# Patient Record
Sex: Female | Born: 2000
Health system: Southern US, Community
[De-identification: ages and names within clinical notes are randomized; demographics above are authoritative.]

## PROBLEM LIST (undated history)

## (undated) DIAGNOSIS — Z789 Other specified health status: Secondary | ICD-10-CM

## (undated) HISTORY — PX: THROAT SURGERY: SHX803

---
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
## 2018-01-29 ENCOUNTER — Other Ambulatory Visit (HOSPITAL_COMMUNITY)
Admission: RE | Admit: 2018-01-29 | Discharge: 2018-01-29 | Disposition: A | Discharge status: Home / Self Care | Payer: BLUE CROSS/BLUE SHIELD | Source: Ambulatory Visit | Attending: Obstetrics and Gynecology | Admitting: Obstetrics and Gynecology

## 2018-01-29 ENCOUNTER — Other Ambulatory Visit: Payer: Self-pay

## 2018-01-29 ENCOUNTER — Ambulatory Visit (INDEPENDENT_AMBULATORY_CARE_PROVIDER_SITE_OTHER): Payer: BLUE CROSS/BLUE SHIELD | Admitting: Obstetrics and Gynecology

## 2018-01-29 ENCOUNTER — Encounter: Payer: Self-pay | Admitting: Obstetrics and Gynecology

## 2018-01-29 VITALS — BP 100/60 | HR 72 | Resp 16 | Ht 66.25 in | Wt 133.0 lb

## 2018-01-29 DIAGNOSIS — Z113 Encounter for screening for infections with a predominantly sexual mode of transmission: Secondary | ICD-10-CM | POA: Insufficient documentation

## 2018-01-29 DIAGNOSIS — Z01419 Encounter for gynecological examination (general) (routine) without abnormal findings: Secondary | ICD-10-CM

## 2018-01-29 MED ORDER — ETONOGESTREL-ETHINYL ESTRADIOL 0.12-0.015 MG/24HR VA RING: 1.0000 | VAGINAL_RING | VAGINAL | 11 refills | Status: DC

## 2018-01-29 NOTE — Progress Notes (Signed)
17 y.o. G0P0000 Single Caucasian female here as a new patient for an annual exam. Referred from Pediatrician - Dr. Loyola Mast.  On OCPs since sophomore in HS. Makes her period lighter.  Not sure she wants to take a pill every day.  Sexually active but not regularly.  No specific problems on OCPs. Cycles were more heavy off OCPs.  No personal hx of migraine with aura, HTN, DVT or PE or FH of this, liver disease.  Just finished her first year at [employer].  Has a job at [employer].   PCP: PCP through [facility]    Patient's last menstrual period was 01/03/2018 (within days).     Period Cycle (Days): 28 Period Duration (Days): 2 Period Pattern: Regular Menstrual Flow: Light Menstrual Control: Tampon Menstrual Control Change Freq (Hours): 5 Dysmenorrhea: (!) Mild Dysmenorrhea Symptoms: Cramping, Headache     Sexually active: Yes.    The current method of family planning is JUNEL FE.    Exercising: Yes.    yoga and walking Smoker:  no  Health Maintenance: Pap:  n/a History of abnormal Pap:  n/a TDaP:  About June 2018 Gardasil:   Yes, completed series HIV: never Hep C: never Screening Labs:  Discuss today   reports that she has never smoked. She has never used smokeless tobacco. She reports that she drinks alcohol. She reports that she does not use drugs.  History reviewed. No pertinent past medical history.  Past Surgical History:  Procedure Laterality Date  . THROAT SURGERY     cyst on throat    Current Outpatient Medications  Medication Sig Dispense Refill  . JUNEL FE 24 1-20 MG-MCG(24) tablet Take 1 tablet by mouth daily.  1   No current facility-administered medications for this visit.     Family History  Problem Relation Age of Onset  . Kidney cancer Father   . Hypertension Maternal Grandfather   . Breast cancer Paternal Grandmother     Review of Systems  Constitutional: Negative.   HENT: Negative.   Eyes: Negative.   Respiratory: Negative.    Cardiovascular: Negative.   Gastrointestinal: Negative.   Endocrine: Negative.   Genitourinary: Negative.   Musculoskeletal: Negative.   Skin: Negative.   Allergic/Immunologic: Negative.   Neurological: Negative.   Hematological: Negative.   Psychiatric/Behavioral: Negative.     Exam:   BP 100/60 (BP Location: Right Arm, Patient Position: Sitting, Cuff Size: Normal)   Pulse 72   Resp 16   Ht 5' 6.25" (1.683 m)   Wt 133 lb (60.3 kg)   LMP 01/03/2018 (Within Days)   BMI 21.31 kg/m     General appearance: alert, cooperative and appears stated age Head: Normocephalic, without obvious abnormality, atraumatic Neck: no adenopathy, supple, symmetrical, trachea midline and thyroid normal to inspection and palpation Lungs: clear to auscultation bilaterally Breasts: normal appearance, no masses or tenderness, No nipple retraction or dimpling, No nipple discharge or bleeding, No axillary or supraclavicular adenopathy Heart: regular rate and rhythm Abdomen: soft, non-tender; no masses, no organomegaly Extremities: extremities normal, atraumatic, no cyanosis or edema Skin: Skin color, texture, turgor normal. No rashes or lesions Lymph nodes: Cervical, supraclavicular, and axillary nodes normal. No abnormal inguinal nodes palpated Neurologic: Grossly normal  Pelvic: External genitalia:  no lesions              Urethra:  normal appearing urethra with no masses, tenderness or lesions              Bartholins  and Skenes: normal                 Vagina: normal appearing vagina with normal color and discharge, no lesions              Cervix: no lesions              Pap taken: No. Bimanual Exam:  Uterus:  normal size, contour, position, consistency, mobility, non-tender              Adnexa: no mass, fullness, tenderness             Chaperone was present for exam.  Assessment:   Well woman visit with normal exam. STD screening.  Contraceptive counseling.   Plan: Mammogram  screening. Recommended self breast awareness. Pap and HR HPV as above. Guidelines for Calcium, Vitamin D, regular exercise program including cardiovascular and weight bearing exercise. STD screening.  I recommend condom use.  We discussed contraceptive options - pills, ring, patch, IUDs, Depo Provera.   She will try NuvaRing.  Instructed in used.  Refills for one year.  Follow up annually and prn.   After visit summary provided.

## 2018-01-29 NOTE — Patient Instructions (Signed)

## 2018-01-30 LAB — CERVICOVAGINAL ANCILLARY ONLY
Chlamydia: NEGATIVE
Neisseria Gonorrhea: NEGATIVE
Trichomonas: NEGATIVE

## 2018-01-30 LAB — HEP, RPR, HIV PANEL
HIV Screen 4th Generation wRfx: NONREACTIVE
Hepatitis B Surface Ag: NEGATIVE
RPR Ser Ql: NONREACTIVE

## 2018-01-30 LAB — HEPATITIS C ANTIBODY: Hep C Virus Ab: <0.1 s/co ratio (ref 0.0–0.9)

## 2018-08-19 ENCOUNTER — Encounter: Payer: Self-pay | Admitting: Obstetrics and Gynecology

## 2018-08-19 ENCOUNTER — Other Ambulatory Visit: Payer: Self-pay

## 2018-08-19 ENCOUNTER — Ambulatory Visit (INDEPENDENT_AMBULATORY_CARE_PROVIDER_SITE_OTHER): Payer: BLUE CROSS/BLUE SHIELD | Admitting: Obstetrics and Gynecology

## 2018-08-19 ENCOUNTER — Telehealth: Payer: Self-pay | Admitting: Obstetrics and Gynecology

## 2018-08-19 VITALS — BP 124/80 | HR 64 | Wt 136.2 lb

## 2018-08-19 DIAGNOSIS — B373 Candidiasis of vulva and vagina: Secondary | ICD-10-CM | POA: Diagnosis not present

## 2018-08-19 DIAGNOSIS — B3731 Acute candidiasis of vulva and vagina: Secondary | ICD-10-CM

## 2018-08-19 DIAGNOSIS — N760 Acute vaginitis: Secondary | ICD-10-CM | POA: Diagnosis not present

## 2018-08-19 DIAGNOSIS — B9689 Other specified bacterial agents as the cause of diseases classified elsewhere: Secondary | ICD-10-CM | POA: Diagnosis not present

## 2018-08-19 MED ORDER — METRONIDAZOLE 0.75 % VA GEL
1.0000 | Freq: Every day | VAGINAL | 0 refills | Status: DC
Start: 2018-08-19 — End: 2018-12-04

## 2018-08-19 MED ORDER — FLUCONAZOLE 150 MG PO TABS: 150.0000 mg | ORAL_TABLET | Freq: Once | ORAL | 0 refills | Status: AC

## 2018-08-19 NOTE — Progress Notes (Signed)
GYNECOLOGY  VISIT   HPI: 17 y.o.   Single White or Caucasian Not Hispanic or Latino  female   G0P0000 with Patient's last menstrual period was 07/30/2018.   here for retained tampon since last menses that she pulled it out today. Reports she is having vaginal discharge with an odor. Denies any fever or chills. Since she took the tampon out she has had mild cramping. No pain prior to today. She was having a BM today and the tampon came down to the point that she could feel it. She has noticed an odor over the last 2 weeks.   GYNECOLOGIC HISTORY: Patient's last menstrual period was 07/30/2018. Contraception: Nuvaring Menopausal hormone therapy: None        OB History    Gravida  0   Para  0   Term  0   Preterm  0   AB  0   Living  0     SAB  0   TAB  0   Ectopic  0   Multiple  0   Live Births  0              There are no active problems to display for this patient.   History reviewed. No pertinent past medical history.  Past Surgical History:  Procedure Laterality Date  . THROAT SURGERY     cyst on throat    Current Outpatient Medications  Medication Sig Dispense Refill  . etonogestrel-ethinyl estradiol (NUVARING) 0.12-0.015 MG/24HR vaginal ring Place 1 each vaginally every 28 (twenty-eight) days. Insert vaginally and leave in place for 3 consecutive weeks, then remove for 1 week. 1 each 11   No current facility-administered medications for this visit.      ALLERGIES: Penicillins  Family History  Problem Relation Age of Onset  . Kidney cancer Father   . Hypertension Maternal Grandfather   . Breast cancer Paternal Grandmother     Social History   Socioeconomic History  . Marital status: Single    Spouse name: Not on file  . Number of children: Not on file  . Years of education: Not on file  . Highest education level: Not on file  Occupational History  . Not on file  Social Needs  . Financial resource strain: Not on file  . Food insecurity:     Worry: Not on file    Inability: Not on file  . Transportation needs:    Medical: Not on file    Non-medical: Not on file  Tobacco Use  . Smoking status: Never Smoker  . Smokeless tobacco: Never Used  Substance and Sexual Activity  . Alcohol use: Yes    Comment: twice a week   . Drug use: No  . Sexual activity: Yes    Birth control/protection: Inserts    Comment: Nuvaring  Lifestyle  . Physical activity:    Days per week: Not on file    Minutes per session: Not on file  . Stress: Not on file  Relationships  . Social connections:    Talks on phone: Not on file    Gets together: Not on file    Attends religious service: Not on file    Active member of club or organization: Not on file    Attends meetings of clubs or organizations: Not on file    Relationship status: Not on file  . Intimate partner violence:    Fear of current or ex partner: Not on file    Emotionally  abused: Not on file    Physically abused: Not on file    Forced sexual activity: Not on file  Other Topics Concern  . Not on file  Social History Narrative  . Not on file    Review of Systems  Constitutional: Negative.   HENT: Negative.   Eyes: Negative.   Respiratory: Negative.   Cardiovascular: Negative.   Gastrointestinal: Negative.   Genitourinary:       Vaginal discharge Vaginal odor  Musculoskeletal: Negative.   Skin: Negative.   Neurological: Negative.   Endo/Heme/Allergies: Negative.   Psychiatric/Behavioral: Negative.     PHYSICAL EXAMINATION:    BP 124/80 (BP Location: Right Arm, Patient Position: Sitting, Cuff Size: Normal)   Pulse 64   Wt 136 lb 3.2 oz (61.8 kg)   LMP 07/30/2018   BMI 21.82 kg/m     General appearance: alert, cooperative and appears stated age   Pelvic: External genitalia:  no lesions              Urethra:  normal appearing urethra with no masses, tenderness or lesions              Bartholins and Skenes: normal                 Vagina: normal appearing  vagina with a slight increase in watery and thick white vaginal d/c, no erosions, no lesions              Cervix: no cervical motion tenderness and no lesions              Bimanual Exam:  Uterus:  normal size, contour, position, consistency, mobility, non-tender and anteverted              Adnexa: no masses, mildly tender left adnexa.               Chaperone was present for exam.  Wet prep: + clue, no trich, + wbc KOH: +++ yeast PH: 5   ASSESSMENT Patient had a tampon in for 3 weeks, pulled it out today C/O vaginal odor Mild cramping, no signs of pelvic infection on exam BV and yeast on exam    PLAN Metrogel and diflucan Call with any concerns   An After Visit Summary was printed and given to the patient.

## 2018-08-19 NOTE — Telephone Encounter (Signed)
Patient would like to speak with a nurse. States she had a tampon in that she did not know was there.

## 2018-08-19 NOTE — Patient Instructions (Signed)

## 2018-08-19 NOTE — Telephone Encounter (Signed)
Spoke with patient. Patient states she had a BMD today and a tampon came out of vagina the same time. Patient states her last menses was 3 wks ago, that was the last time she used a tampon. Patient reports vaginal odor. Denies vaginal d/c, fever/chills, pelvic pain. Uses Nuvaring for contraceptive. [facility] [place] [facility]. Requesting OV. Is agreeable to scheduling with covering provider.   Patient placed on brief hold, reviewed Dr. Oscar LaJertson schedule with Fredrich BirksK. Sprague, RN.   Instructed patient to come directly to office, scheduled for 4pm, will work into schedule.   Routing to provider for final review. Patient is agreeable to disposition. Will close encounter.  Cc: Dr. Edward JollySilva

## 2018-09-24 DIAGNOSIS — E781 Pure hyperglyceridemia: Secondary | ICD-10-CM

## 2018-09-24 HISTORY — DX: Pure hyperglyceridemia: E78.1

## 2018-10-26 DIAGNOSIS — J020 Streptococcal pharyngitis: Secondary | ICD-10-CM | POA: Diagnosis not present

## 2018-12-02 DIAGNOSIS — S42422A Displaced comminuted supracondylar fracture without intercondylar fracture of left humerus, initial encounter for closed fracture: Secondary | ICD-10-CM | POA: Diagnosis not present

## 2018-12-02 DIAGNOSIS — S59902A Unspecified injury of left elbow, initial encounter: Secondary | ICD-10-CM | POA: Diagnosis not present

## 2018-12-02 DIAGNOSIS — M25522 Pain in left elbow: Secondary | ICD-10-CM | POA: Diagnosis not present

## 2018-12-02 DIAGNOSIS — Y9323 Activity, snow (alpine) (downhill) skiing, snow boarding, sledding, tobogganing and snow tubing: Secondary | ICD-10-CM | POA: Diagnosis not present

## 2018-12-02 DIAGNOSIS — Y929 Unspecified place or not applicable: Secondary | ICD-10-CM | POA: Diagnosis not present

## 2018-12-04 ENCOUNTER — Other Ambulatory Visit: Payer: Self-pay

## 2018-12-04 ENCOUNTER — Encounter (HOSPITAL_COMMUNITY): Payer: Self-pay | Admitting: *Deleted

## 2018-12-04 DIAGNOSIS — M25522 Pain in left elbow: Secondary | ICD-10-CM | POA: Diagnosis not present

## 2018-12-04 DIAGNOSIS — S42472A Displaced transcondylar fracture of left humerus, initial encounter for closed fracture: Secondary | ICD-10-CM | POA: Diagnosis not present

## 2018-12-04 NOTE — H&P (Signed)
Sherronda Waddell is an 18 y.o. female.   Chief Complaint: LEFT ELBOW INJURY  HPI: The patient is a 18 year old right-hand dominant female who injured her left elbow on March 10 while skiing in [place]. She was examined after the injury and had radiographs and a CT scan. She was put into a splint and came home for further treatment.  She was seen in our office. She continues to have pain, weakness, stiffness, and swelling.   We discussed the need for surgical intervention to realign the bone.  The patient will remain in the splint until the time of surgery.  She is here today for surgery.  She denies chest pain, shortness of breath, nausea, vomiting, diarrhea, fever, or chills.    No past medical history on file.  Past Surgical History:  Procedure Laterality Date  . THROAT SURGERY     cyst on throat    Family History  Problem Relation Age of Onset  . Kidney cancer Father   . Hypertension Maternal Grandfather   . Breast cancer Paternal Grandmother    Social History:  reports that she has never smoked. She has never used smokeless tobacco. She reports current alcohol use. She reports that she does not use drugs.  Allergies:  Allergies  Allergen Reactions  . Penicillins Rash    Did it involve swelling of the face/tongue/throat, SOB, or low BP? Unknown Did it involve sudden or severe rash/hives, skin peeling, or any reaction on the inside of your mouth or nose? Yes Did you need to seek medical attention at a hospital or doctor's office? No When did it last happen?childhood  If all above answers are "NO", may proceed with cephalosporin use.     No medications prior to admission.    No results found for this or any previous visit (from the past 48 hour(s)). No results found.  Review of Systems  Respiratory: Positive for wheezing.    NO RECENT ILLNESSES OR HOSPITALIZATIONS  There were no vitals taken for this visit. Physical Exam  General Appearance:  Alert,  cooperative, no distress, appears stated age  Head:  Normocephalic, without obvious abnormality, atraumatic  Eyes:  Pupils equal, conjunctiva/corneas clear,         Throat: Lips, mucosa, and tongue normal; teeth and gums normal  Neck: No visible masses     Lungs:   respirations unlabored  Chest Wall:  No tenderness or deformity  Heart:  Regular rate and rhythm,  Abdomen:   Soft, non-tender,         Extremities: LUE: splint in place, fingers warm well perfused Weakness with ulnar nerve function Able to cross fingers, able to make a ok sign   Pulses: 2+ and symmetric  Skin: Skin color, texture, turgor normal, no rashes or lesions     Neurologic: Normal     Assessment LEFT ELBOW DISTAL HUMERUS FRACTURE   Plan LEFT ELBOW OPEN REDUCTION AND INTERNAL FIXATION WITH REPAIR AS INDICATED    WE ARE PLANNING SURGERY FOR YOUR UPPER EXTREMITY. THE RISKS AND BENEFITS OF SURGERY INCLUDE BUT NOT LIMITED TO BLEEDING INFECTION, DAMAGE TO NEARBY NERVES ARTERIES TENDONS, FAILURE OF SURGERY TO ACCOMPLISH ITS INTENDED GOALS, PERSISTENT SYMPTOMS AND NEED FOR FURTHER SURGICAL INTERVENTION. WITH THIS IN MIND WE WILL PROCEED. I HAVE DISCUSSED WITH THE PATIENT THE PRE AND POSTOPERATIVE REGIMEN AND THE DOS AND DON'TS. PT VOICED UNDERSTANDING AND INFORMED CONSENT SIGNED.   R/B/A DISCUSSED WITH PT IN OFFICE.  PT VOICED UNDERSTANDING OF PLAN CONSENT SIGNED DAY  OF SURGERY PT SEEN AND EXAMINED PRIOR TO OPERATIVE PROCEDURE/DAY OF SURGERY SITE MARKED. QUESTIONS ANSWERED WILL REMAIN IN HOSPITAL 23 HOUR OBSERVATION FOLLOWING SURGERY  Lory Nowaczyk [facility] MD 12/05/18    Karma Greaser 12/04/2018, 3:16 PM

## 2018-12-04 NOTE — Progress Notes (Signed)
Spoke with pt's mother, Zella Ball for pre-op call. DPR on file. She states pt does not have a cardiac history.

## 2018-12-05 ENCOUNTER — Ambulatory Visit (HOSPITAL_COMMUNITY): Payer: BLUE CROSS/BLUE SHIELD | Admitting: Anesthesiology

## 2018-12-05 ENCOUNTER — Ambulatory Visit (HOSPITAL_COMMUNITY)
Admission: RE | Admit: 2018-12-05 | Discharge: 2018-12-07 | Disposition: A | Discharge status: Home / Self Care | Payer: BLUE CROSS/BLUE SHIELD | Attending: Orthopedic Surgery | Admitting: Orthopedic Surgery

## 2018-12-05 ENCOUNTER — Encounter (HOSPITAL_COMMUNITY): Payer: Self-pay

## 2018-12-05 ENCOUNTER — Other Ambulatory Visit: Payer: Self-pay

## 2018-12-05 ENCOUNTER — Encounter (HOSPITAL_COMMUNITY)
Admission: RE | Disposition: A | Discharge status: Home / Self Care | Payer: Self-pay | Source: Home / Self Care | Attending: Orthopedic Surgery

## 2018-12-05 DIAGNOSIS — S42472A Displaced transcondylar fracture of left humerus, initial encounter for closed fracture: Secondary | ICD-10-CM | POA: Insufficient documentation

## 2018-12-05 DIAGNOSIS — Z880 Allergy status to penicillin: Secondary | ICD-10-CM | POA: Diagnosis not present

## 2018-12-05 DIAGNOSIS — S42402A Unspecified fracture of lower end of left humerus, initial encounter for closed fracture: Secondary | ICD-10-CM | POA: Diagnosis not present

## 2018-12-05 DIAGNOSIS — S42472D Displaced transcondylar fracture of left humerus, subsequent encounter for fracture with routine healing: Secondary | ICD-10-CM | POA: Diagnosis not present

## 2018-12-05 DIAGNOSIS — G8918 Other acute postprocedural pain: Secondary | ICD-10-CM | POA: Diagnosis not present

## 2018-12-05 DIAGNOSIS — Y9323 Activity, snow (alpine) (downhill) skiing, snow boarding, sledding, tobogganing and snow tubing: Secondary | ICD-10-CM | POA: Insufficient documentation

## 2018-12-05 HISTORY — PX: ORIF HUMERUS FRACTURE: SHX2126

## 2018-12-05 HISTORY — DX: Other specified health status: Z78.9

## 2018-12-05 LAB — CBC
HCT: 39.1 % (ref 36.0–46.0)
Hemoglobin: 12.7 g/dL (ref 12.0–15.0)
MCH: 28.4 pg (ref 26.0–34.0)
MCHC: 32.5 g/dL (ref 30.0–36.0)
MCV: 87.5 fL (ref 80.0–100.0)
Platelets: 322 10*3/uL (ref 150–400)
RBC: 4.47 MIL/uL (ref 3.87–5.11)
RDW: 11.9 % (ref 11.5–15.5)
WBC: 8.1 10*3/uL (ref 4.0–10.5)
nRBC: 0 % (ref 0.0–0.2)

## 2018-12-05 LAB — POCT PREGNANCY, URINE: Preg Test, Ur: NEGATIVE

## 2018-12-05 SURGERY — OPEN REDUCTION INTERNAL FIXATION (ORIF) DISTAL HUMERUS FRACTURE
Anesthesia: General | Site: Arm Upper | Laterality: Left

## 2018-12-05 MED ORDER — MIDAZOLAM HCL 2 MG/2ML IJ SOLN
INTRAMUSCULAR | Status: AC
  Administered 2018-12-05: 2 mg via INTRAVENOUS
  Filled 2018-12-05: qty 2

## 2018-12-05 MED ORDER — LIDOCAINE HCL (CARDIAC) PF 100 MG/5ML IV SOSY
PREFILLED_SYRINGE | INTRAVENOUS | Status: DC | PRN
  Administered 2018-12-05: 60 mg via INTRAVENOUS

## 2018-12-05 MED ORDER — ONDANSETRON HCL 4 MG PO TABS: 4.0000 mg | ORAL_TABLET | Freq: Four times a day (QID) | ORAL | Status: DC | PRN

## 2018-12-05 MED ORDER — BUPIVACAINE HCL (PF) 0.25 % IJ SOLN
INTRAMUSCULAR | Status: AC
  Filled 2018-12-05: qty 30

## 2018-12-05 MED ORDER — OXYCODONE HCL 5 MG PO TABS: 5.0000 mg | ORAL_TABLET | Freq: Once | ORAL | Status: DC | PRN

## 2018-12-05 MED ORDER — ONDANSETRON HCL 4 MG/2ML IJ SOLN
INTRAMUSCULAR | Status: DC | PRN
  Administered 2018-12-05: 4 mg via INTRAVENOUS

## 2018-12-05 MED ORDER — MEPERIDINE HCL 50 MG/ML IJ SOLN: 6.2500 mg | INTRAMUSCULAR | Status: DC | PRN

## 2018-12-05 MED ORDER — KCL IN DEXTROSE-NACL 20-5-0.45 MEQ/L-%-% IV SOLN
INTRAVENOUS | Status: DC
  Administered 2018-12-05: 22:00:00 via INTRAVENOUS
  Filled 2018-12-05: qty 1000

## 2018-12-05 MED ORDER — LACTATED RINGERS IV SOLN
INTRAVENOUS | Status: DC | PRN
  Administered 2018-12-05 (×2): via INTRAVENOUS

## 2018-12-05 MED ORDER — OXYCODONE HCL 5 MG PO TABS
5.0000 mg | ORAL_TABLET | ORAL | Status: DC | PRN
  Administered 2018-12-06 (×6): 10 mg via ORAL
  Administered 2018-12-07 (×2): 5 mg via ORAL
  Filled 2018-12-05: qty 2
  Filled 2018-12-05: qty 1
  Filled 2018-12-05 (×3): qty 2
  Filled 2018-12-05: qty 1
  Filled 2018-12-05 (×2): qty 2

## 2018-12-05 MED ORDER — ONDANSETRON HCL 4 MG/2ML IJ SOLN
4.0000 mg | Freq: Once | INTRAMUSCULAR | Status: AC | PRN
  Administered 2018-12-05: 4 mg via INTRAVENOUS

## 2018-12-05 MED ORDER — ROCURONIUM 10MG/ML (10ML) SYRINGE FOR MEDFUSION PUMP - OPTIME
INTRAVENOUS | Status: DC | PRN
  Administered 2018-12-05: 20 mg via INTRAVENOUS
  Administered 2018-12-05: 30 mg via INTRAVENOUS

## 2018-12-05 MED ORDER — ONDANSETRON HCL 4 MG/2ML IJ SOLN
INTRAMUSCULAR | Status: AC
  Filled 2018-12-05: qty 2

## 2018-12-05 MED ORDER — ALPRAZOLAM 0.5 MG PO TABS: 0.5000 mg | ORAL_TABLET | Freq: Four times a day (QID) | ORAL | Status: DC | PRN

## 2018-12-05 MED ORDER — FENTANYL CITRATE (PF) 100 MCG/2ML IJ SOLN
INTRAMUSCULAR | Status: AC
  Administered 2018-12-05: 100 ug via INTRAVENOUS
  Filled 2018-12-05: qty 2

## 2018-12-05 MED ORDER — CLINDAMYCIN PHOSPHATE 900 MG/50ML IV SOLN
INTRAVENOUS | Status: AC
  Filled 2018-12-05: qty 50

## 2018-12-05 MED ORDER — PROPOFOL 10 MG/ML IV BOLUS
INTRAVENOUS | Status: DC | PRN
  Administered 2018-12-05: 150 mg via INTRAVENOUS

## 2018-12-05 MED ORDER — ACETAMINOPHEN 160 MG/5ML PO SOLN: 325.0000 mg | ORAL | Status: DC | PRN

## 2018-12-05 MED ORDER — CLINDAMYCIN PHOSPHATE 900 MG/50ML IV SOLN
900.0000 mg | INTRAVENOUS | Status: AC
  Administered 2018-12-05: 900 mg via INTRAVENOUS

## 2018-12-05 MED ORDER — MIDAZOLAM HCL 2 MG/2ML IJ SOLN
INTRAMUSCULAR | Status: AC
  Filled 2018-12-05: qty 2

## 2018-12-05 MED ORDER — METHOCARBAMOL 500 MG PO TABS
500.0000 mg | ORAL_TABLET | Freq: Four times a day (QID) | ORAL | Status: DC | PRN
  Administered 2018-12-06 (×3): 500 mg via ORAL
  Filled 2018-12-05 (×3): qty 1

## 2018-12-05 MED ORDER — DEXAMETHASONE SODIUM PHOSPHATE 10 MG/ML IJ SOLN
INTRAMUSCULAR | Status: DC | PRN
  Administered 2018-12-05: 5 mg via INTRAVENOUS

## 2018-12-05 MED ORDER — FENTANYL CITRATE (PF) 250 MCG/5ML IJ SOLN
INTRAMUSCULAR | Status: DC | PRN
  Administered 2018-12-05 (×3): 50 ug via INTRAVENOUS

## 2018-12-05 MED ORDER — MIDAZOLAM HCL 2 MG/2ML IJ SOLN
2.0000 mg | Freq: Once | INTRAMUSCULAR | Status: AC
  Administered 2018-12-05: 2 mg via INTRAVENOUS
  Filled 2018-12-05: qty 2

## 2018-12-05 MED ORDER — FENTANYL CITRATE (PF) 100 MCG/2ML IJ SOLN: 25.0000 ug | INTRAMUSCULAR | Status: DC | PRN

## 2018-12-05 MED ORDER — METHOCARBAMOL 1000 MG/10ML IJ SOLN
500.0000 mg | Freq: Four times a day (QID) | INTRAVENOUS | Status: DC | PRN
  Filled 2018-12-05: qty 5

## 2018-12-05 MED ORDER — FENTANYL CITRATE (PF) 100 MCG/2ML IJ SOLN
100.0000 ug | Freq: Once | INTRAMUSCULAR | Status: AC
  Administered 2018-12-05: 100 ug via INTRAVENOUS
  Filled 2018-12-05: qty 2

## 2018-12-05 MED ORDER — SODIUM CHLORIDE 0.9 % IV SOLN
INTRAVENOUS | Status: DC | PRN
  Administered 2018-12-05: 25 ug/min via INTRAVENOUS

## 2018-12-05 MED ORDER — ADULT MULTIVITAMIN W/MINERALS CH
1.0000 | ORAL_TABLET | Freq: Every day | ORAL | Status: DC
  Administered 2018-12-06 – 2018-12-07 (×2): 1 via ORAL
  Filled 2018-12-05 (×2): qty 1

## 2018-12-05 MED ORDER — HYDROMORPHONE HCL 1 MG/ML IJ SOLN
0.5000 mg | INTRAMUSCULAR | Status: DC | PRN
  Administered 2018-12-06 – 2018-12-07 (×5): 1 mg via INTRAVENOUS
  Filled 2018-12-05 (×5): qty 1

## 2018-12-05 MED ORDER — FENTANYL CITRATE (PF) 250 MCG/5ML IJ SOLN
INTRAMUSCULAR | Status: AC
  Filled 2018-12-05: qty 5

## 2018-12-05 MED ORDER — ONDANSETRON HCL 4 MG/2ML IJ SOLN: 4.0000 mg | Freq: Four times a day (QID) | INTRAMUSCULAR | Status: DC | PRN

## 2018-12-05 MED ORDER — HYDROCODONE-ACETAMINOPHEN 7.5-325 MG PO TABS
1.0000 | ORAL_TABLET | ORAL | Status: DC | PRN
  Administered 2018-12-06: 1 via ORAL
  Filled 2018-12-05: qty 1

## 2018-12-05 MED ORDER — CLINDAMYCIN PHOSPHATE 600 MG/50ML IV SOLN
600.0000 mg | Freq: Three times a day (TID) | INTRAVENOUS | Status: AC
  Administered 2018-12-05 – 2018-12-06 (×4): 600 mg via INTRAVENOUS
  Filled 2018-12-05 (×6): qty 50

## 2018-12-05 MED ORDER — BUPIVACAINE HCL (PF) 0.25 % IJ SOLN
INTRAMUSCULAR | Status: DC | PRN
  Administered 2018-12-05: 30 mL

## 2018-12-05 MED ORDER — KETOROLAC TROMETHAMINE 30 MG/ML IJ SOLN: 30.0000 mg | Freq: Once | INTRAMUSCULAR | Status: DC | PRN

## 2018-12-05 MED ORDER — PROPOFOL 10 MG/ML IV BOLUS
INTRAVENOUS | Status: AC
  Filled 2018-12-05: qty 20

## 2018-12-05 MED ORDER — DIPHENHYDRAMINE HCL 25 MG PO CAPS: 25.0000 mg | ORAL_CAPSULE | Freq: Four times a day (QID) | ORAL | Status: DC | PRN

## 2018-12-05 MED ORDER — MIDAZOLAM HCL 5 MG/5ML IJ SOLN
INTRAMUSCULAR | Status: DC | PRN
  Administered 2018-12-05: 2 mg via INTRAVENOUS

## 2018-12-05 MED ORDER — OXYCODONE HCL 5 MG/5ML PO SOLN: 5.0000 mg | Freq: Once | ORAL | Status: DC | PRN

## 2018-12-05 MED ORDER — ACETAMINOPHEN 325 MG PO TABS: 325.0000 mg | ORAL_TABLET | ORAL | Status: DC | PRN

## 2018-12-05 MED ORDER — ETONOGESTREL-ETHINYL ESTRADIOL 0.12-0.015 MG/24HR VA RING: 1.0000 | VAGINAL_RING | VAGINAL | Status: DC

## 2018-12-05 MED ORDER — 0.9 % SODIUM CHLORIDE (POUR BTL) OPTIME
TOPICAL | Status: DC | PRN
  Administered 2018-12-05 (×2): 1000 mL

## 2018-12-05 MED ORDER — GLYCOPYRROLATE 0.2 MG/ML IJ SOLN
INTRAMUSCULAR | Status: DC | PRN
  Administered 2018-12-05: 0.4 mg via INTRAVENOUS

## 2018-12-05 MED ORDER — CHLORHEXIDINE GLUCONATE 4 % EX LIQD: 60.0000 mL | Freq: Once | CUTANEOUS | Status: DC

## 2018-12-05 MED ORDER — NEOSTIGMINE METHYLSULFATE 10 MG/10ML IV SOLN
INTRAVENOUS | Status: DC | PRN
  Administered 2018-12-05: 3 mg via INTRAVENOUS

## 2018-12-05 SURGICAL SUPPLY — 107 items
BANDAGE ACE 3X5.8 VEL STRL LF (GAUZE/BANDAGES/DRESSINGS) ×1 IMPLANT
BANDAGE ACE 4X5 VEL STRL LF (GAUZE/BANDAGES/DRESSINGS) ×2 IMPLANT
BIT DRILL 1.8 CANN MAX VPC (BIT) ×1 IMPLANT
BIT DRILL 2.0 (BIT) ×2
BIT DRILL 2.5X2.75 QC CALB (BIT) ×2 IMPLANT
BIT DRILL 2XNS DISP SS SM FRAG (BIT) IMPLANT
BIT DRILL 4.8X200 CANN (BIT) ×1 IMPLANT
BIT DRILL CALIBRATED 2.7 (BIT) ×1 IMPLANT
BIT DRL 2XNS DISP SS SM FRAG (BIT) ×1
BLADE LONG MED 31X9 (MISCELLANEOUS) ×1 IMPLANT
BNDG CMPR 9X4 STRL LF SNTH (GAUZE/BANDAGES/DRESSINGS) ×1
BNDG ESMARK 4X9 LF (GAUZE/BANDAGES/DRESSINGS) ×2 IMPLANT
BNDG GAUZE ELAST 4 BULKY (GAUZE/BANDAGES/DRESSINGS) ×1 IMPLANT
CABLE CERCLAGE W/NDL CRIMP (Cable) IMPLANT
CABLE CERLAGE W/NEEDLE CRIMP (Cable) ×2 IMPLANT
CONNECTOR 5 IN 1 STRAIGHT STRL (MISCELLANEOUS) ×1 IMPLANT
CORDS BIPOLAR (ELECTRODE) ×1 IMPLANT
COVER MAYO STAND STRL (DRAPES) ×2 IMPLANT
COVER SURGICAL LIGHT HANDLE (MISCELLANEOUS) ×2 IMPLANT
COVER WAND RF STERILE (DRAPES) ×2 IMPLANT
CUFF TOURN SGL QUICK 18X4 (TOURNIQUET CUFF) ×1 IMPLANT
CUFF TOURNIQUET SINGLE 18IN (TOURNIQUET CUFF) ×2 IMPLANT
CUFF TOURNIQUET SINGLE 24IN (TOURNIQUET CUFF) IMPLANT
DRAPE IMP U-DRAPE 54X76 (DRAPES) ×2 IMPLANT
DRAPE INCISE IOBAN 66X45 STRL (DRAPES) ×1 IMPLANT
DRAPE OEC MINIVIEW 54X84 (DRAPES) IMPLANT
DRSG ADAPTIC 3X8 NADH LF (GAUZE/BANDAGES/DRESSINGS) IMPLANT
GAUZE SPONGE 4X4 12PLY STRL (GAUZE/BANDAGES/DRESSINGS) ×1 IMPLANT
GAUZE XEROFORM 5X9 LF (GAUZE/BANDAGES/DRESSINGS) ×1 IMPLANT
GLOVE BIOGEL PI IND STRL 8.5 (GLOVE) ×1 IMPLANT
GLOVE BIOGEL PI INDICATOR 8.5 (GLOVE) ×1
GLOVE INDICATOR 7.5 STRL GRN (GLOVE) ×1 IMPLANT
GLOVE SS BIOGEL STRL SZ 8 (GLOVE) IMPLANT
GLOVE SUPERSENSE BIOGEL SZ 8 (GLOVE) ×1
GLOVE SURG ORTHO 8.0 STRL STRW (GLOVE) ×2 IMPLANT
GOWN STRL REUS W/ TWL LRG LVL3 (GOWN DISPOSABLE) ×2 IMPLANT
GOWN STRL REUS W/ TWL XL LVL3 (GOWN DISPOSABLE) ×1 IMPLANT
GOWN STRL REUS W/TWL LRG LVL3 (GOWN DISPOSABLE) ×6
GOWN STRL REUS W/TWL XL LVL3 (GOWN DISPOSABLE) ×2
K-WIRE 0.9X102 (Wire) ×4 IMPLANT
K-WIRE 1.6X20 (Wire) ×2 IMPLANT
K-WIRE ACE 1.6X6 (WIRE) ×2
K-WIRE COCR 0.9X95 (WIRE) ×4
K-WIRE COCR 1.1X105 (WIRE) ×4
KIT BASIN OR (CUSTOM PROCEDURE TRAY) ×2 IMPLANT
KIT TURNOVER KIT B (KITS) ×2 IMPLANT
KWIRE 0.9X102 (Wire) IMPLANT
KWIRE 1.1 (Wire) ×2 IMPLANT
KWIRE 1.6X20 (Wire) IMPLANT
KWIRE ACE 1.6X6 (WIRE) IMPLANT
KWIRE COCR 0.9X95 (WIRE) IMPLANT
KWIRE COCR 1.1X105 (WIRE) IMPLANT
LOOP VESSEL MAXI BLUE (MISCELLANEOUS) IMPLANT
MANIFOLD NEPTUNE II (INSTRUMENTS) ×2 IMPLANT
NDL HYPO 25GX1X1/2 BEV (NEEDLE) IMPLANT
NEEDLE HYPO 25GX1X1/2 BEV (NEEDLE) IMPLANT
NS IRRIG 1000ML POUR BTL (IV SOLUTION) ×2 IMPLANT
PACK ORTHO EXTREMITY (CUSTOM PROCEDURE TRAY) ×2 IMPLANT
PACK UNIVERSAL I (CUSTOM PROCEDURE TRAY) ×2 IMPLANT
PAD ARMBOARD 7.5X6 YLW CONV (MISCELLANEOUS) ×4 IMPLANT
PAD CAST 4YDX4 CTTN HI CHSV (CAST SUPPLIES) IMPLANT
PADDING CAST COTTON 4X4 STRL (CAST SUPPLIES)
PASSER SUT SWANSON 36MM LOOP (INSTRUMENTS) ×1 IMPLANT
PIN GUIDE DRILL TIP 2.8X300 (DRILL) ×1 IMPLANT
PLATE DIST HUM LAT LT SM (Plate) ×1 IMPLANT
PLATE DIST HUM MEDIAL LT SM (Plate) ×1 IMPLANT
PUTTY DBM STAGRAFT PLUS 2CC (Putty) ×1 IMPLANT
SCREW CANN 6.5X85X40MM THRD (Screw) ×2 IMPLANT
SCREW CORT 3.5X26 (Screw) ×2 IMPLANT
SCREW CORT 3.5X32 (Screw) ×2 IMPLANT
SCREW CORT T15 24X3.5XST LCK (Screw) IMPLANT
SCREW CORT T15 26X3.5XST LCK (Screw) IMPLANT
SCREW CORT T15 32X3.5XST LCK (Screw) IMPLANT
SCREW CORT T15 TPR 55X3.5XST (Screw) IMPLANT
SCREW CORTICAL 3.5X24MM (Screw) ×6 IMPLANT
SCREW CORTICAL 3.5X55MM (Screw) ×2 IMPLANT
SCREW LOCK 3.5X12 DIST TIB (Screw) ×1 IMPLANT
SCREW LOCK 3.5X14 DIST TIB (Screw) ×1 IMPLANT
SCREW LOCK 3.5X18 DIST TIB (Screw) ×1 IMPLANT
SCREW LOCK CORT STAR 3.5X10 (Screw) ×1 IMPLANT
SCREW LOCK CORT STAR 3.5X12 (Screw) ×2 IMPLANT
SCREW LOCK CORT STAR 3.5X28 (Screw) ×1 IMPLANT
SCREW MAX VPC  2.5X20 (Screw) ×1 IMPLANT
SCREW MAX VPC 2.5X20 (Screw) IMPLANT
SCREW VPC 2.5X22MM (Wire) IMPLANT
SOAP 2 % CHG 4 OZ (WOUND CARE) ×2 IMPLANT
SPLINT FIBERGLASS 3X35 (CAST SUPPLIES) ×1 IMPLANT
SPONGE LAP 18X18 RF (DISPOSABLE) ×1 IMPLANT
SUT FIBERWIRE 2-0 18 17.9 3/8 (SUTURE) ×2
SUT MERSILENE 4 0 P 3 (SUTURE) IMPLANT
SUT PROLENE 3 0 PS 2 (SUTURE) ×4 IMPLANT
SUT PROLENE 4 0 PS 2 18 (SUTURE) IMPLANT
SUT VIC AB 0 CT1 27 (SUTURE) ×6
SUT VIC AB 0 CT1 27XBRD ANBCTR (SUTURE) IMPLANT
SUT VIC AB 2-0 CT1 27 (SUTURE) ×6
SUT VIC AB 2-0 CT1 TAPERPNT 27 (SUTURE) IMPLANT
SUT VIC AB 4-0 PS2 27 (SUTURE) ×1 IMPLANT
SUTURE FIBERWR 2-0 18 17.9 3/8 (SUTURE) IMPLANT
SYR CONTROL 10ML LL (SYRINGE) IMPLANT
TOWEL OR 17X24 6PK STRL BLUE (TOWEL DISPOSABLE) ×2 IMPLANT
TOWEL OR 17X26 10 PK STRL BLUE (TOWEL DISPOSABLE) ×4 IMPLANT
TUBE CONNECTING 12X1/4 (SUCTIONS) ×1 IMPLANT
UNDERPAD 30X30 (UNDERPADS AND DIAPERS) ×2 IMPLANT
VPC SCREW 2.5X22MM (Wire) ×2 IMPLANT
WASHER 3.5MM (Orthopedic Implant) ×3 IMPLANT
WASHER FLAT 6.5MM (Washer) ×1 IMPLANT
WATER STERILE IRR 1000ML POUR (IV SOLUTION) ×2 IMPLANT

## 2018-12-05 NOTE — Op Note (Signed)
PREOPERATIVE DIAGNOSIS: Left elbow supracondylar transcondylar distal humerus fracture with intercondylar extension  POSTOPERATIVE DIAGNOSIS: Same  ATTENDING SURGEON: Dr. Bradly Bienenstock who is scrubbed and present for the entire procedure  ASSISTANT SURGEON: Dr. Onalee Hua who is scrubbed and present and necessary for exposure osteotomy reduction internal fixation and closure.  ANESTHESIA: General via endotracheal tube with supraclavicular regional block  OPERATIVE PROCEDURE: #1: Open treatment of humeral supracondylar fracture with intra-articular extension with internal fixation #2: Left elbow olecranon osteotomy #3: Left elbow ulnar nerve release and anterior transposition #4: Radiographs, stress radiography left elbow  IMPLANTS: Biomet medial and elbow lateral plating with a 6 5 cannulated screw for the olecranon osteotomy and Zimmer cables.  One2.4 compression screw Biomet, 2 cc of Biomet stay graft within the olecranon fossa  RADIOGRAPHIC INTERPRETATION: AP lateral and oblique views of the elbow do show the internal fixation in place with good alignment of the radiocapitellar joint and ulnohumeral joint and closure of the osteotomy  SURGICAL INDICATIONS: Patient is a right-hand-dominant female who sustained the unfortunate skiing injury with a closed left distal humerus fracture.  Patient was seen and evaluated in the office and recommended undergo the above procedure.  Risks of surgery include but not limited to bleeding infection damage nearby nerves arteries or tendons loss of motion of the wrist and digits incomplete relief of symptoms nonunion malunion hardware failure need for further surgical intervention  SURGICAL TECHNIQUE: Patient was brought identified in the preoperative holding area marked with a permanent marker made on the left elbow and indicate the correct operative site.  Patient brought back to operating placed supine on the anesthesia table where general anesthesia was  administered.  Preoperative antibiotics were given prior to skin incision.  A beanbag was then used and the patient was then placed in the lateral decubitus position with the left side up.  Axillary roll was placed and held pressure points well-padded the ulnar nerve on the right arm was then well-padded and protected.  Sterile U drape was then applied proximally.  The left upper extremities then prepped and draped normal sterile fashion.  A timeout was called the correct site was identified and the procedure then begun.  Sterile tourniquet was then applied the limb was then elevated and tourniquet insufflated to 250 mmHg.  Curvilinear incision was made posteriorly curving at the olecranon tip radially.  Large skin flaps were then raised.  Dissection was then carried out medially and laterally to raise the skin flaps.  Good exposure of the triceps mechanism and proximal olecranon was done.  Blunt dissection carried out medially identifying the medial intermuscular septum and identifying the ulnar nerve.  Careful dissection was then done and the ulnar nerve was then identified proximally.  It was then released and traced through the the cubital tunnel through the FCU a small Penrose was applied around the nerve and the nerve was carefully protected throughout the entire procedure.  The nerve was but was able to be transposed anteriorly moving out of the way of the field in order to gain exposure to the medial column.  Once the nerve was carefully exposed attention was then turned to the olecranon osteotomy a small sponge was then placed across the joint the joint was then opened and the sponge was then used to placed small countertraction on the joint.  A chevron osteotomy was then tailored on the dorsal surface of the bone.  Prior to the osteotomy the guidepin for the 6 5 cannulated screw was then placed  appropriate alignment was then done on the mini C arm.  Following this the appropriate drill bit was then used  and the screw was then placed across the proximal segment of the bone.  The guidewire was left in.  The osteotomy was then created with a small sagittal saw.  The guidepin was then removed and the osteotomy completed with a osteotome.  The triceps mechanism was then elevated medially and laterally to expose the distal humerus.  Upon exposure of the distal humerus again the patient did have the multiple fracture fragments several loose metaphyseal fragments were then removed within the olecranon fossa.  The patient had a large medial column fracture as well as the low lateral column fracture as well as a small fracture anteriorly just anterior to the olecranon fossa.  Following this the fracture hematoma was then evacuated.  Reduction clamps were then used to stabilize the medial column.  Once the medial column was then stabilized with reduction clamp screw fixation was then carried out medially holding it to the lateral column rather nicely.  After screw fixation along the medial column attention was then sure recreation of the joint and the trochlea and the spool of the distal humerus.  Again the patient did have the anterior fracture fragment.  This was then carefully opened up and manipulated keeping the anterior soft tissue capsule attachment.  Guidepins were then placed into the fracture fragment suture passers were then used to the lasso and create a horizontal mattress type suture in order to gain traction on this piece.  Once this was done we were able to hold the piece in place hold it reduced and a micro 2.4 millimeter screw was compression screw was then placed from a posterior to anterior direction making sure to avoid the trochlear groove.  This held the fracture fragment well.  The FiberWire suture was also tied down in the olecranon fossa rather nicely.  Once this piece was then reduced and fixed fixed the lateral column was able to be reduced nicely.  The lateral column was reduced and held in place  with a reduction clamp.  Following this the lateral column plate was then applied and screw fixation was then carried out from a lateral to medial direction.  One screw was then placed all the way across the joint to hold the entire joint surface well aligned going from a medial to lateral direction.  Screws were then placed proximal to the fracture site with good purchase a combination of locking and nonlocking screws.  The wound was then thoroughly irrigated.  Final radiographs were then obtained.  There was good restoration of the joint surface in all planes.  After fixation of the columns attention was then turned to fixation of the ulnar osteotomy.  The osteotomy was held in place with the reduction clamps the guidepin was then placed the 6.5 millimeter screw that had been premeasured to 85 mm was then placed.  The Zimmer cable was placed across the proximal ulna and then in a figure-of-eight tension band construct the screw was tightened all the way down to bone with the washer in place.  Final tensioning of the cable system was then carried out with the crimper the wires were then cut with good reduction and good position of the implants confirmed using the mini C arm.  The osteotomy closed up very nicely.  The wound was then thoroughly irrigated.  The lateral and medial intervals of the triceps were then closed with 0 Vicryl  suture.  The nerve was able to be transposed anteriorly and a small subcutaneous sling was then carried out to keep the nerve anteriorly.  This was anterior to the medial aspect of the plate medially.  Following this the subcutaneous tissues were closed with 2-0 Vicryl.  Subcutaneous tissue was also closed with 4-0 Vicryl.  The skin closed with simple 4-0 Prolene sutures.  Xeroform dressing and a sterile compressive bandage then applied.  The patient was then placed in a long-arm posterior splint extubated taken recovery in good condition.  POSTOPERATIVE PLAN: Patient be admitted  overnight for IV biotics and pain control Seen back in the office in 6 days for wound check x-rays down to see our therapist for a long-arm splint begin working on some gentle active range of motion active assisted range of motion and active motion.  Radiographs at each visit see her again at the the 2-week mark for suture removal.  Prognosis: The patient did have a highly comminuted transcondylar in the intercondylar fracture.  The joint lined up very nicely.  Given the recreation of the joint surface and the reduction I would anticipate her getting back a good functional degree of movement.  The family was counseled about the significant injury to the distal humerus and how stubborn the elbow may be in terms of rehabilitation.  We will continue to seek aggressive therapy even as she is away at school trying to get back the functional range of motion of her elbow.

## 2018-12-05 NOTE — Anesthesia Procedure Notes (Signed)
Procedure Name: Intubation Date/Time: 12/05/2018 2:25 PM Performed by: Tillman Abide, CRNA Pre-anesthesia Checklist: Patient identified, Emergency Drugs available, Suction available and Patient being monitored Patient Re-evaluated:Patient Re-evaluated prior to induction Oxygen Delivery Method: Circle System Utilized Preoxygenation: Pre-oxygenation with 100% oxygen Induction Type: IV induction Ventilation: Mask ventilation without difficulty Laryngoscope Size: Miller and 2 Grade View: Grade I Tube type: Oral Tube size: 7.0 mm Number of attempts: 1 Airway Equipment and Method: Stylet Placement Confirmation: ETT inserted through vocal cords under direct vision,  positive ETCO2 and breath sounds checked- equal and bilateral Secured at: 21 cm Tube secured with: Tape Dental Injury: Teeth and Oropharynx as per pre-operative assessment

## 2018-12-05 NOTE — Transfer of Care (Signed)
Immediate Anesthesia Transfer of Care Note  Patient: Miranda Salinas  Procedure(s) Performed: OPEN REDUCTION INTERNAL FIXATION (ORIF) LEFT DISTAL HUMERUS FRACTURE (Left Arm Upper)  Patient Location: PACU  Anesthesia Type:General  Level of Consciousness: awake, oriented, drowsy and patient cooperative  Airway & Oxygen Therapy: Patient Spontanous Breathing  Post-op Assessment: Report given to RN and Post -op Vital signs reviewed and stable  Post vital signs: Reviewed and stable  Last Vitals:  Vitals Value Taken Time  BP 109/54 12/05/2018  6:28 PM  Temp    Pulse 85 12/05/2018  6:30 PM  Resp 51 12/05/2018  6:30 PM  SpO2 98 % 12/05/2018  6:30 PM  Vitals shown include unvalidated device data.  Last Pain:  Vitals:   12/05/18 1159  TempSrc: Oral  PainSc: 5       Patients Stated Pain Goal: 3 (12/05/18 1159)  Complications: No apparent anesthesia complications

## 2018-12-05 NOTE — Anesthesia Postprocedure Evaluation (Signed)
Anesthesia Post Note  Patient: Miranda Salinas  Procedure(s) Performed: OPEN REDUCTION INTERNAL FIXATION (ORIF) LEFT DISTAL HUMERUS FRACTURE (Left Arm Upper)     Patient location during evaluation: PACU Anesthesia Type: General Level of consciousness: awake and alert and awake Pain management: pain level controlled Vital Signs Assessment: post-procedure vital signs reviewed and stable Respiratory status: spontaneous breathing, nonlabored ventilation, respiratory function stable and patient connected to nasal cannula oxygen Cardiovascular status: blood pressure returned to baseline and stable Postop Assessment: no apparent nausea or vomiting Anesthetic complications: no    Last Vitals:  Vitals:   12/05/18 1915 12/05/18 1930  BP: (!) 108/52 (!) 103/50  Pulse: 85 75  Resp: 20 17  Temp:  36.6 C  SpO2: 97% 98%    Last Pain:  Vitals:   12/05/18 1930  TempSrc:   PainSc: 0-No pain                 Cecile Hearing

## 2018-12-05 NOTE — Anesthesia Preprocedure Evaluation (Addendum)
Anesthesia Evaluation  Patient identified by MRN, date of birth, ID band Patient awake    Reviewed: Allergy & Precautions, NPO status , Patient's Chart, lab work & pertinent test results  Airway Mallampati: I       Dental no notable dental hx. (+) Teeth Intact   Pulmonary neg pulmonary ROS,    Pulmonary exam normal breath sounds clear to auscultation       Cardiovascular negative cardio ROS Normal cardiovascular exam Rhythm:Regular Rate:Normal     Neuro/Psych negative neurological ROS  negative psych ROS   GI/Hepatic negative GI ROS, Neg liver ROS,   Endo/Other  negative endocrine ROS  Renal/GU negative Renal ROS  negative genitourinary   Musculoskeletal negative musculoskeletal ROS (+)   Abdominal Normal abdominal exam  (+)   Peds  Hematology negative hematology ROS (+)   Anesthesia Other Findings   Reproductive/Obstetrics                             Anesthesia Physical Anesthesia Plan  ASA: I  Anesthesia Plan: General   Post-op Pain Management:  Regional for Post-op pain   Induction: Intravenous  PONV Risk Score and Plan: 3 and Ondansetron, Dexamethasone, Midazolam and Scopolamine patch - Pre-op  Airway Management Planned: LMA  Additional Equipment:   Intra-op Plan:   Post-operative Plan: Extubation in OR  Informed Consent: I have reviewed the patients History and Physical, chart, labs and discussed the procedure including the risks, benefits and alternatives for the proposed anesthesia with the patient or authorized representative who has indicated his/her understanding and acceptance.     Dental advisory given  Plan Discussed with: CRNA  Anesthesia Plan Comments:         Anesthesia Quick Evaluation

## 2018-12-05 NOTE — Progress Notes (Signed)
Pharmacy Antibiotic Note  Miranda Salinas is a 18 y.o. female admitted on 12/05/2018 with post-op orthopedic hand surgery.  Pharmacy has been consulted for clindamycin dosing.   Plan: -Clindamycin 600 mg IV Q 8 hours -Monitor CBC and clinical progress   Height: 5\' 6"  (167.6 cm) Weight: 135 lb (61.2 kg) IBW/kg (Calculated) : 59.3  Temp (24hrs), Avg:97.9 F (36.6 C), Min:97.2 F (36.2 C), Max:98.7 F (37.1 C)  Recent Labs  Lab 12/05/18 1214  WBC 8.1    CrCl cannot be calculated (Patient's most recent lab result is older than the maximum 21 days allowed.).    Allergies  Allergen Reactions  . Penicillins Rash    Did it involve swelling of the face/tongue/throat, SOB, or low BP? Unknown Did it involve sudden or severe rash/hives, skin peeling, or any reaction on the inside of your mouth or nose? Yes Did you need to seek medical attention at a hospital or doctor's office? No When did it last happen?childhood  If all above answers are "NO", may proceed with cephalosporin use.     Thank you for allowing pharmacy to be a part of this patient's care.  Vinnie Level, PharmD., BCPS Clinical Pharmacist Clinical phone for 12/05/18 until 10:30pm: [phone] If after 10:30pm, please refer to [facility] for unit-specific pharmacist

## 2018-12-05 NOTE — Anesthesia Procedure Notes (Addendum)
Anesthesia Regional Block: Supraclavicular block   Pre-Anesthetic Checklist: ,, timeout performed, Correct Patient, Correct Site, Correct Laterality, Correct Procedure, Correct Position, site marked, Risks and benefits discussed,  Surgical consent,  Pre-op evaluation,  At surgeon's request and post-op pain management  Laterality: Upper and Left  Prep: chloraprep       Needles:  Injection technique: Single-shot  Needle Type: Echogenic Stimulator Needle     Needle Length: 10cm  Needle Gauge: 21   Needle insertion depth: 2 cm   Additional Needles:   Procedures:,,,, ultrasound used (permanent image in chart),,,,  Narrative:  Start time: 12/05/2018 1:20 PM End time: 12/05/2018 1:35 PM Injection made incrementally with aspirations every 5 mL.  Performed by: Personally  Anesthesiologist: Leilani Able, MD

## 2018-12-06 ENCOUNTER — Encounter (HOSPITAL_COMMUNITY): Payer: Self-pay

## 2018-12-06 DIAGNOSIS — S42472A Displaced transcondylar fracture of left humerus, initial encounter for closed fracture: Secondary | ICD-10-CM | POA: Diagnosis not present

## 2018-12-06 DIAGNOSIS — Y9323 Activity, snow (alpine) (downhill) skiing, snow boarding, sledding, tobogganing and snow tubing: Secondary | ICD-10-CM | POA: Diagnosis not present

## 2018-12-06 DIAGNOSIS — Z880 Allergy status to penicillin: Secondary | ICD-10-CM | POA: Diagnosis not present

## 2018-12-06 NOTE — Progress Notes (Signed)
Patient was seen earlier today.  Contact has been made with the family today.  The patient was not ready to go home from a pain standpoint.  Patient did have the significant surgery to her elbow and her pain was not controlled with oral pain medications. Patient is going to remain in the hospital overnight for pain control.  Will reassess in the morning. The mother and the patient voiced understanding of this plan. It is reasonable to continue the IV and oral narcotics to help control the discomfort.  The patient will remain an inpatient following surgery for pain control.

## 2018-12-06 NOTE — Plan of Care (Signed)

## 2018-12-06 NOTE — Progress Notes (Signed)
Pt states she had dizziness when parents assisted her to toilet. Denies any symptoms when observed by this Clinical research associate. VSS and documented. Educated on risk for orthostatic hypotension and given tips to avoid. Educated on effectiveness of PO oxycodone. Discouraged use of IV dilaudid unless severe pain as orders state.

## 2018-12-06 NOTE — Plan of Care (Signed)
  Problem: Education: Goal: Knowledge of General Education information will improve Description: Including pain rating scale, medication(s)/side effects and non-pharmacologic comfort measures Outcome: Progressing   Problem: Activity: Goal: Risk for activity intolerance will decrease Outcome: Progressing   Problem: Pain Managment: Goal: General experience of comfort will improve Outcome: Progressing   

## 2018-12-06 NOTE — Progress Notes (Signed)
Educated pt and family on non-pharmacologic methods to reduce pain. Pt does state she is attempting distraction. Encouraged ambulation with staff assistance. Discussed other non-pharmacologic strategies. Pt states she will consider.

## 2018-12-07 DIAGNOSIS — Z880 Allergy status to penicillin: Secondary | ICD-10-CM | POA: Diagnosis not present

## 2018-12-07 DIAGNOSIS — Y9323 Activity, snow (alpine) (downhill) skiing, snow boarding, sledding, tobogganing and snow tubing: Secondary | ICD-10-CM | POA: Diagnosis not present

## 2018-12-07 DIAGNOSIS — S42472A Displaced transcondylar fracture of left humerus, initial encounter for closed fracture: Secondary | ICD-10-CM | POA: Diagnosis not present

## 2018-12-07 MED ORDER — OXYCODONE-ACETAMINOPHEN 5-325 MG PO TABS: 1.0000 | ORAL_TABLET | ORAL | 0 refills | Status: AC | PRN

## 2018-12-07 NOTE — Discharge Instructions (Signed)
[facility] CALL OFFICE FOR F/U APPT [phone] in 4 days  ALSO MAKE THERAPY APPT TO FOLLOW FOR SPLINT [phone] EXT 1601 DR Melvyn Novas CELL [phone] KEEP HAND ELEVATED ABOVE HEART OK TO APPLY ICE TO OPERATIVE AREA CONTACT OFFICE IF ANY WORSENING PAIN OR CONCERNS.

## 2018-12-07 NOTE — Discharge Summary (Signed)
Physician Discharge Summary  Patient ID: Miranda Salinas MRN: [id number] DOB/AGE: 2001-03-29 18 y.o.  Admit date: 12/05/2018 Discharge date: 12/07/18  Admission Diagnoses: left elbow distal humerus fracture Past Medical History:  Diagnosis Date  . Medical history non-contributory     Discharge Diagnoses:  Active Problems:   Closed fracture of left distal humerus   Surgeries: Procedure(s): OPEN REDUCTION INTERNAL FIXATION (ORIF) LEFT DISTAL HUMERUS FRACTURE on 12/05/2018    Consultants: NONE  Discharged Condition: Improved  Hospital Course: Miranda Salinas is an 18 y.o. female who was admitted 12/05/2018 with a chief complaint of No chief complaint on file. , and found to have a diagnosis of left elbow distal humerus fracture.  They were brought to the operating room on 12/05/2018 and underwent Procedure(s): OPEN REDUCTION INTERNAL FIXATION (ORIF) LEFT DISTAL HUMERUS FRACTURE.    They were given perioperative antibiotics:  Anti-infectives (From admission, onward)   Start     Dose/Rate Route Frequency Ordered Stop   12/05/18 2200  clindamycin (CLEOCIN) IVPB 600 mg     600 mg 100 mL/hr over 30 Minutes Intravenous Every 8 hours 12/05/18 2018 12/06/18 2300   12/05/18 1155  clindamycin (CLEOCIN) 900 MG/50ML IVPB    Note to Pharmacy:  Blair Promise   : cabinet override      12/05/18 1155 12/05/18 1436   12/05/18 1145  clindamycin (CLEOCIN) IVPB 900 mg     900 mg 100 mL/hr over 30 Minutes Intravenous On call to O.R. 12/05/18 1140 12/05/18 1436    .  They were given sequential compression devices, early ambulation, and Other (comment) for DVT prophylaxis.  Recent vital signs:  Patient Vitals for the past 24 hrs:  BP Temp Temp src Pulse Resp SpO2  12/07/18 0500 125/70 98.2 F (36.8 C) Oral 92 16 99 %  12/06/18 2051 127/61 98.3 F (36.8 C) Oral 98 17 100 %  12/06/18 1413 (!) 112/59 98.3 F (36.8 C) Oral 81 16 100 %  .  Recent laboratory studies: No results  found.  Discharge Medications:   Allergies as of 12/07/2018      Reactions   Penicillins Rash   Did it involve swelling of the face/tongue/throat, SOB, or low BP? Unknown Did it involve sudden or severe rash/hives, skin peeling, or any reaction on the inside of your mouth or nose? Yes Did you need to seek medical attention at a hospital or doctor's office? No When did it last happen?childhood  If all above answers are "NO", may proceed with cephalosporin use.      Medication List    STOP taking these medications   HYDROcodone-acetaminophen 5-325 MG tablet Commonly known as:  NORCO/VICODIN     TAKE these medications   etonogestrel-ethinyl estradiol 0.12-0.015 MG/24HR vaginal ring Commonly known as:  NuvaRing Place 1 each vaginally every 28 (twenty-eight) days. Insert vaginally and leave in place for 3 consecutive weeks, then remove for 1 week.   ibuprofen 800 MG tablet Commonly known as:  ADVIL,MOTRIN Take 800 mg by mouth every 6 (six) hours as needed (pain).   ondansetron 4 MG tablet Commonly known as:  ZOFRAN Take 4 mg by mouth every 8 (eight) hours as needed for nausea or vomiting.   oxyCODONE-acetaminophen 5-325 MG tablet Commonly known as:  Percocet Take 1 tablet by mouth every 4 (four) hours as needed for up to 5 days for severe pain.       Diagnostic Studies: No results found.  They benefited maximally from their hospital stay and  there were no complications.     Disposition: Discharge disposition: 01-Home or Self Care       Follow-up Information    Bradly Bienenstock, MD In 6 days.   Specialty:  Orthopedic Surgery Contact information: [address] [place] [address] [phone]            Signed: Sharma Covert 12/07/2018, 10:23 AM

## 2018-12-07 NOTE — Plan of Care (Signed)
  Problem: Pain Managment: Goal: General experience of comfort will improve Outcome: Progressing   Problem: Safety: Goal: Ability to remain free from injury will improve Outcome: Progressing   

## 2018-12-08 ENCOUNTER — Encounter (HOSPITAL_COMMUNITY): Payer: Self-pay | Admitting: Orthopedic Surgery

## 2018-12-11 DIAGNOSIS — S42402D Unspecified fracture of lower end of left humerus, subsequent encounter for fracture with routine healing: Secondary | ICD-10-CM | POA: Diagnosis not present

## 2018-12-11 DIAGNOSIS — M25522 Pain in left elbow: Secondary | ICD-10-CM | POA: Diagnosis not present

## 2018-12-17 ENCOUNTER — Other Ambulatory Visit: Payer: Self-pay | Admitting: Obstetrics and Gynecology

## 2018-12-18 DIAGNOSIS — S42402D Unspecified fracture of lower end of left humerus, subsequent encounter for fracture with routine healing: Secondary | ICD-10-CM | POA: Diagnosis not present

## 2018-12-18 DIAGNOSIS — M25522 Pain in left elbow: Secondary | ICD-10-CM | POA: Diagnosis not present

## 2018-12-18 NOTE — Telephone Encounter (Signed)
Medication refill request: Nuvaring Last AEX: 01/29/2018  Next AEX: 02/20/2019 Last MMG (if hormonal medication request): N/A Refill authorized: Nuvaring #[id number] [id number]  Please refill if appropriate.

## 2018-12-26 DIAGNOSIS — M25522 Pain in left elbow: Secondary | ICD-10-CM | POA: Diagnosis not present

## 2018-12-30 DIAGNOSIS — M25522 Pain in left elbow: Secondary | ICD-10-CM | POA: Diagnosis not present

## 2019-01-01 DIAGNOSIS — S42402D Unspecified fracture of lower end of left humerus, subsequent encounter for fracture with routine healing: Secondary | ICD-10-CM | POA: Diagnosis not present

## 2019-01-01 DIAGNOSIS — M25522 Pain in left elbow: Secondary | ICD-10-CM | POA: Diagnosis not present

## 2019-01-07 DIAGNOSIS — M25522 Pain in left elbow: Secondary | ICD-10-CM | POA: Diagnosis not present

## 2019-01-09 DIAGNOSIS — M25522 Pain in left elbow: Secondary | ICD-10-CM | POA: Diagnosis not present

## 2019-01-14 DIAGNOSIS — M25522 Pain in left elbow: Secondary | ICD-10-CM | POA: Diagnosis not present

## 2019-01-16 DIAGNOSIS — M25522 Pain in left elbow: Secondary | ICD-10-CM | POA: Diagnosis not present

## 2019-01-21 DIAGNOSIS — M25522 Pain in left elbow: Secondary | ICD-10-CM | POA: Diagnosis not present

## 2019-01-22 DIAGNOSIS — S42402D Unspecified fracture of lower end of left humerus, subsequent encounter for fracture with routine healing: Secondary | ICD-10-CM | POA: Diagnosis not present

## 2019-01-23 DIAGNOSIS — M25522 Pain in left elbow: Secondary | ICD-10-CM | POA: Diagnosis not present

## 2019-01-27 DIAGNOSIS — M25622 Stiffness of left elbow, not elsewhere classified: Secondary | ICD-10-CM | POA: Diagnosis not present

## 2019-01-27 DIAGNOSIS — S42402D Unspecified fracture of lower end of left humerus, subsequent encounter for fracture with routine healing: Secondary | ICD-10-CM | POA: Diagnosis not present

## 2019-01-27 DIAGNOSIS — M25522 Pain in left elbow: Secondary | ICD-10-CM | POA: Diagnosis not present

## 2019-01-29 DIAGNOSIS — M25522 Pain in left elbow: Secondary | ICD-10-CM | POA: Diagnosis not present

## 2019-01-29 DIAGNOSIS — S42402D Unspecified fracture of lower end of left humerus, subsequent encounter for fracture with routine healing: Secondary | ICD-10-CM | POA: Diagnosis not present

## 2019-01-29 DIAGNOSIS — M25622 Stiffness of left elbow, not elsewhere classified: Secondary | ICD-10-CM | POA: Diagnosis not present

## 2019-02-02 DIAGNOSIS — M25522 Pain in left elbow: Secondary | ICD-10-CM | POA: Diagnosis not present

## 2019-02-02 DIAGNOSIS — S42402D Unspecified fracture of lower end of left humerus, subsequent encounter for fracture with routine healing: Secondary | ICD-10-CM | POA: Diagnosis not present

## 2019-02-02 DIAGNOSIS — M25622 Stiffness of left elbow, not elsewhere classified: Secondary | ICD-10-CM | POA: Diagnosis not present

## 2019-02-04 DIAGNOSIS — M25522 Pain in left elbow: Secondary | ICD-10-CM | POA: Diagnosis not present

## 2019-02-04 DIAGNOSIS — M25622 Stiffness of left elbow, not elsewhere classified: Secondary | ICD-10-CM | POA: Diagnosis not present

## 2019-02-04 DIAGNOSIS — S42402D Unspecified fracture of lower end of left humerus, subsequent encounter for fracture with routine healing: Secondary | ICD-10-CM | POA: Diagnosis not present

## 2019-02-09 ENCOUNTER — Other Ambulatory Visit: Payer: Self-pay | Admitting: Obstetrics and Gynecology

## 2019-02-09 DIAGNOSIS — M25522 Pain in left elbow: Secondary | ICD-10-CM | POA: Diagnosis not present

## 2019-02-09 DIAGNOSIS — S42402D Unspecified fracture of lower end of left humerus, subsequent encounter for fracture with routine healing: Secondary | ICD-10-CM | POA: Diagnosis not present

## 2019-02-09 DIAGNOSIS — M25622 Stiffness of left elbow, not elsewhere classified: Secondary | ICD-10-CM | POA: Diagnosis not present

## 2019-02-09 NOTE — Telephone Encounter (Signed)
Medication refill request: nuvaring Last AEX:  01-29-18 Next AEX: 02-20-2019 Last MMG (if hormonal medication request): none Refill authorized: please approve if appropriate

## 2019-02-11 DIAGNOSIS — S42402D Unspecified fracture of lower end of left humerus, subsequent encounter for fracture with routine healing: Secondary | ICD-10-CM | POA: Diagnosis not present

## 2019-02-11 DIAGNOSIS — M25522 Pain in left elbow: Secondary | ICD-10-CM | POA: Diagnosis not present

## 2019-02-11 DIAGNOSIS — M25622 Stiffness of left elbow, not elsewhere classified: Secondary | ICD-10-CM | POA: Diagnosis not present

## 2019-02-12 ENCOUNTER — Encounter: Payer: Self-pay | Admitting: Obstetrics and Gynecology

## 2019-02-12 ENCOUNTER — Ambulatory Visit (INDEPENDENT_AMBULATORY_CARE_PROVIDER_SITE_OTHER): Payer: BLUE CROSS/BLUE SHIELD | Admitting: Obstetrics and Gynecology

## 2019-02-12 ENCOUNTER — Other Ambulatory Visit: Payer: Self-pay

## 2019-02-12 VITALS — BP 110/70 | HR 68 | Temp 98.1°F | Resp 16 | Ht 66.5 in | Wt 129.0 lb

## 2019-02-12 DIAGNOSIS — Z113 Encounter for screening for infections with a predominantly sexual mode of transmission: Secondary | ICD-10-CM | POA: Diagnosis not present

## 2019-02-12 DIAGNOSIS — E781 Pure hyperglyceridemia: Secondary | ICD-10-CM

## 2019-02-12 DIAGNOSIS — Z01419 Encounter for gynecological examination (general) (routine) without abnormal findings: Secondary | ICD-10-CM | POA: Diagnosis not present

## 2019-02-12 DIAGNOSIS — S42402D Unspecified fracture of lower end of left humerus, subsequent encounter for fracture with routine healing: Secondary | ICD-10-CM | POA: Diagnosis not present

## 2019-02-12 MED ORDER — NUVARING 0.12-0.015 MG/24HR VA RING: VAGINAL_RING | VAGINAL | 3 refills | Status: DC

## 2019-02-12 NOTE — Progress Notes (Signed)
18 y.o. G0P0000 Single Caucasian female here for annual exam.    Happy with the NuvaRing.   Wants general labs.   [place] and [place].  Has had a prolonged recovery.  Studying at [employer].   PCP:   No PCP.   Patient's last menstrual period was 01/22/2019.           Sexually active: Yes.    The current method of family planning is Paediatric nurseuva Ring.  Uses condoms also.  Exercising: Yes.    walking Smoker:  no  Health Maintenance: Pap:  n/a History of abnormal Pap:  n/a TDaP:  About 2018 Gardasil:   Yes, completed series HIV and Hep C: never Screening Labs:  Discuss if needed   reports that she has never smoked. She has never used smokeless tobacco. She reports current alcohol use. She reports that she does not use drugs.  Past Medical History:  Diagnosis Date  . Medical history non-contributory     Past Surgical History:  Procedure Laterality Date  . ORIF HUMERUS FRACTURE Left 12/05/2018   Procedure: OPEN REDUCTION INTERNAL FIXATION (ORIF) LEFT DISTAL HUMERUS FRACTURE;  Surgeon: Bradly Bienenstockrtmann, Fred, MD;  Location: [facility];  Service: Orthopedics;  Laterality: Left;  . THROAT SURGERY     cyst on throat    Current Outpatient Medications  Medication Sig Dispense Refill  . ibuprofen (ADVIL,MOTRIN) 800 MG tablet Take 800 mg by mouth every 6 (six) hours as needed (pain).    Marland Kitchen. NUVARING 0.12-0.015 MG/24HR vaginal ring INSERT 1 RING VAGINALLY AND LEAVE IN PLACE FOR 3 CONSECUTIVE WEEKS, THEN REMOVE FOR ONE WEEK 1 each 0   No current facility-administered medications for this visit.     Family History  Problem Relation Age of Onset  . Kidney cancer Father   . Hypertension Maternal Grandfather   . Breast cancer Paternal Grandmother     Review of Systems  All other systems reviewed and are negative.   Exam:   BP 110/70   Pulse 68   Temp 98.1 F (36.7 C) (Oral)   Resp 16   Ht 5' 6.5" (1.689 m)   Wt 129 lb (58.5 kg)   LMP 01/22/2019   BMI 20.51 kg/m      General appearance: alert, cooperative and appears stated age Head: Normocephalic, without obvious abnormality, atraumatic Neck: no adenopathy, supple, symmetrical, trachea midline and thyroid normal to inspection and palpation Lungs: clear to auscultation bilaterally Breasts: normal appearance, no masses or tenderness, No nipple retraction or dimpling, No nipple discharge or bleeding, No axillary or supraclavicular adenopathy Heart: regular rate and rhythm Abdomen: soft, non-tender; no masses, no organomegaly Extremities: extremities normal, atraumatic, no cyanosis or edema Skin: Skin color, texture, turgor normal. No rashes or lesions Lymph nodes: Cervical, supraclavicular, and axillary nodes normal. No abnormal inguinal nodes palpated Neurologic: Grossly normal  Pelvic: External genitalia:  no lesions              Urethra:  normal appearing urethra with no masses, tenderness or lesions              Bartholins and Skenes: normal                 Vagina: normal appearing vagina with normal color and discharge, no lesions              Cervix: no lesions              Pap taken: No. Bimanual Exam:  Uterus:  normal size, contour, position, consistency, mobility, non-tender.  NuvaRing in place.               Adnexa: no mass, fullness, tenderness      Chaperone was present for exam.  Assessment:   Well woman visit with normal exam. STD screening.  Plan: Mammogram screening age 67. Recommended self breast awareness. Pap and HR HPV as above. Guidelines for Calcium, Vitamin D, regular exercise program including cardiovascular and weight bearing exercise. Routine labs and STD screening. Refill of NuvaRing for one year.  Follow up annually and prn.   After visit summary provided.

## 2019-02-12 NOTE — Patient Instructions (Signed)

## 2019-02-13 LAB — COMPREHENSIVE METABOLIC PANEL
ALT: 7 IU/L (ref 0–32)
AST: 11 IU/L (ref 0–40)
Albumin/Globulin Ratio: 1.7 (ref 1.2–2.2)
Albumin: 4.5 g/dL (ref 3.9–5.0)
Alkaline Phosphatase: 60 IU/L (ref 39–117)
BUN/Creatinine Ratio: 13 (ref 9–23)
BUN: 10 mg/dL (ref 6–20)
Bilirubin Total: 0.2 mg/dL (ref 0.0–1.2)
CO2: 21 mmol/L (ref 20–29)
Calcium: 9.5 mg/dL (ref 8.7–10.2)
Chloride: 106 mmol/L (ref 96–106)
Creatinine, Ser: 0.78 mg/dL (ref 0.57–1.00)
GFR calc Af Amer: 127 mL/min/{1.73_m2} (ref >59)
GFR calc non Af Amer: 111 mL/min/{1.73_m2} (ref >59)
Globulin, Total: 2.6 g/dL (ref 1.5–4.5)
Glucose: 81 mg/dL (ref 65–99)
Potassium: 4.6 mmol/L (ref 3.5–5.2)
Sodium: 142 mmol/L (ref 134–144)
Total Protein: 7.1 g/dL (ref 6.0–8.5)

## 2019-02-13 LAB — CBC
Hematocrit: 40.2 % (ref 34.0–46.6)
Hemoglobin: 13.2 g/dL (ref 11.1–15.9)
MCH: 28.9 pg (ref 26.6–33.0)
MCHC: 32.8 g/dL (ref 31.5–35.7)
MCV: 88 fL (ref 79–97)
Platelets: 351 10*3/uL (ref 150–450)
RBC: 4.57 x10E6/uL (ref 3.77–5.28)
RDW: 12.7 % (ref 11.7–15.4)
WBC: 5 10*3/uL (ref 3.4–10.8)

## 2019-02-13 LAB — LIPID PANEL
Chol/HDL Ratio: 3.2 ratio (ref 0.0–4.4)
Cholesterol, Total: 207 mg/dL — ABNORMAL HIGH (ref 100–169)
HDL: 65 mg/dL
LDL Calculated: 105 mg/dL (ref 0–109)
Triglycerides: 187 mg/dL — ABNORMAL HIGH (ref 0–89)
VLDL Cholesterol Cal: 37 mg/dL (ref 5–40)

## 2019-02-13 LAB — HEPATITIS C ANTIBODY: Hep C Virus Ab: <0.1 s/co ratio (ref 0.0–0.9)

## 2019-02-13 LAB — HEP, RPR, HIV PANEL
HIV Screen 4th Generation wRfx: NONREACTIVE
Hepatitis B Surface Ag: NEGATIVE
RPR Ser Ql: NONREACTIVE

## 2019-02-15 LAB — CHLAMYDIA/GONOCOCCUS/TRICHOMONAS, NAA
Chlamydia by NAA: NEGATIVE
Gonococcus by NAA: NEGATIVE
Trich vag by NAA: NEGATIVE

## 2019-02-16 ENCOUNTER — Encounter: Payer: Self-pay | Admitting: Obstetrics and Gynecology

## 2019-02-16 NOTE — Addendum Note (Signed)
Addended by: Ardell Isaacs, Debbe Bales E on: 02/16/2019 08:11 PM   Modules accepted: Orders

## 2019-02-17 DIAGNOSIS — S42402D Unspecified fracture of lower end of left humerus, subsequent encounter for fracture with routine healing: Secondary | ICD-10-CM | POA: Diagnosis not present

## 2019-02-17 DIAGNOSIS — M25622 Stiffness of left elbow, not elsewhere classified: Secondary | ICD-10-CM | POA: Diagnosis not present

## 2019-02-17 DIAGNOSIS — M25522 Pain in left elbow: Secondary | ICD-10-CM | POA: Diagnosis not present

## 2019-02-19 DIAGNOSIS — S42402D Unspecified fracture of lower end of left humerus, subsequent encounter for fracture with routine healing: Secondary | ICD-10-CM | POA: Diagnosis not present

## 2019-02-19 DIAGNOSIS — M25622 Stiffness of left elbow, not elsewhere classified: Secondary | ICD-10-CM | POA: Diagnosis not present

## 2019-02-19 DIAGNOSIS — M25522 Pain in left elbow: Secondary | ICD-10-CM | POA: Diagnosis not present

## 2019-02-20 ENCOUNTER — Ambulatory Visit: Payer: BLUE CROSS/BLUE SHIELD | Admitting: Obstetrics and Gynecology

## 2019-02-23 DIAGNOSIS — M25522 Pain in left elbow: Secondary | ICD-10-CM | POA: Diagnosis not present

## 2019-02-23 DIAGNOSIS — S42402D Unspecified fracture of lower end of left humerus, subsequent encounter for fracture with routine healing: Secondary | ICD-10-CM | POA: Diagnosis not present

## 2019-02-23 DIAGNOSIS — M25622 Stiffness of left elbow, not elsewhere classified: Secondary | ICD-10-CM | POA: Diagnosis not present

## 2019-02-25 DIAGNOSIS — M25522 Pain in left elbow: Secondary | ICD-10-CM | POA: Diagnosis not present

## 2019-02-25 DIAGNOSIS — M25622 Stiffness of left elbow, not elsewhere classified: Secondary | ICD-10-CM | POA: Diagnosis not present

## 2019-02-25 DIAGNOSIS — S42402D Unspecified fracture of lower end of left humerus, subsequent encounter for fracture with routine healing: Secondary | ICD-10-CM | POA: Diagnosis not present

## 2019-02-26 DIAGNOSIS — M24522 Contracture, left elbow: Secondary | ICD-10-CM | POA: Diagnosis not present

## 2019-03-02 DIAGNOSIS — S42402D Unspecified fracture of lower end of left humerus, subsequent encounter for fracture with routine healing: Secondary | ICD-10-CM | POA: Diagnosis not present

## 2019-03-02 DIAGNOSIS — M25622 Stiffness of left elbow, not elsewhere classified: Secondary | ICD-10-CM | POA: Diagnosis not present

## 2019-03-02 DIAGNOSIS — M25522 Pain in left elbow: Secondary | ICD-10-CM | POA: Diagnosis not present

## 2019-03-04 DIAGNOSIS — M25622 Stiffness of left elbow, not elsewhere classified: Secondary | ICD-10-CM | POA: Diagnosis not present

## 2019-03-04 DIAGNOSIS — S42402D Unspecified fracture of lower end of left humerus, subsequent encounter for fracture with routine healing: Secondary | ICD-10-CM | POA: Diagnosis not present

## 2019-03-04 DIAGNOSIS — M25522 Pain in left elbow: Secondary | ICD-10-CM | POA: Diagnosis not present

## 2019-03-10 DIAGNOSIS — M25522 Pain in left elbow: Secondary | ICD-10-CM | POA: Diagnosis not present

## 2019-03-10 DIAGNOSIS — S42402D Unspecified fracture of lower end of left humerus, subsequent encounter for fracture with routine healing: Secondary | ICD-10-CM | POA: Diagnosis not present

## 2019-03-10 DIAGNOSIS — M25622 Stiffness of left elbow, not elsewhere classified: Secondary | ICD-10-CM | POA: Diagnosis not present

## 2019-03-12 DIAGNOSIS — M25522 Pain in left elbow: Secondary | ICD-10-CM | POA: Diagnosis not present

## 2019-03-12 DIAGNOSIS — S42402D Unspecified fracture of lower end of left humerus, subsequent encounter for fracture with routine healing: Secondary | ICD-10-CM | POA: Diagnosis not present

## 2019-03-12 DIAGNOSIS — M25622 Stiffness of left elbow, not elsewhere classified: Secondary | ICD-10-CM | POA: Diagnosis not present

## 2019-03-18 DIAGNOSIS — S42402D Unspecified fracture of lower end of left humerus, subsequent encounter for fracture with routine healing: Secondary | ICD-10-CM | POA: Diagnosis not present

## 2019-03-18 DIAGNOSIS — M25522 Pain in left elbow: Secondary | ICD-10-CM | POA: Diagnosis not present

## 2019-03-18 DIAGNOSIS — M25622 Stiffness of left elbow, not elsewhere classified: Secondary | ICD-10-CM | POA: Diagnosis not present

## 2019-03-23 DIAGNOSIS — S42402D Unspecified fracture of lower end of left humerus, subsequent encounter for fracture with routine healing: Secondary | ICD-10-CM | POA: Diagnosis not present

## 2019-03-23 DIAGNOSIS — M25622 Stiffness of left elbow, not elsewhere classified: Secondary | ICD-10-CM | POA: Diagnosis not present

## 2019-03-23 DIAGNOSIS — M25522 Pain in left elbow: Secondary | ICD-10-CM | POA: Diagnosis not present

## 2019-03-25 DIAGNOSIS — M25622 Stiffness of left elbow, not elsewhere classified: Secondary | ICD-10-CM | POA: Diagnosis not present

## 2019-03-25 DIAGNOSIS — M25522 Pain in left elbow: Secondary | ICD-10-CM | POA: Diagnosis not present

## 2019-03-25 DIAGNOSIS — S42402D Unspecified fracture of lower end of left humerus, subsequent encounter for fracture with routine healing: Secondary | ICD-10-CM | POA: Diagnosis not present

## 2019-03-28 DIAGNOSIS — M24522 Contracture, left elbow: Secondary | ICD-10-CM | POA: Diagnosis not present

## 2019-03-31 DIAGNOSIS — M25622 Stiffness of left elbow, not elsewhere classified: Secondary | ICD-10-CM | POA: Diagnosis not present

## 2019-03-31 DIAGNOSIS — S42402D Unspecified fracture of lower end of left humerus, subsequent encounter for fracture with routine healing: Secondary | ICD-10-CM | POA: Diagnosis not present

## 2019-03-31 DIAGNOSIS — M25522 Pain in left elbow: Secondary | ICD-10-CM | POA: Diagnosis not present

## 2019-04-02 DIAGNOSIS — S42402D Unspecified fracture of lower end of left humerus, subsequent encounter for fracture with routine healing: Secondary | ICD-10-CM | POA: Diagnosis not present

## 2019-04-02 DIAGNOSIS — M25522 Pain in left elbow: Secondary | ICD-10-CM | POA: Diagnosis not present

## 2019-04-02 DIAGNOSIS — M25622 Stiffness of left elbow, not elsewhere classified: Secondary | ICD-10-CM | POA: Diagnosis not present

## 2019-04-07 DIAGNOSIS — S42402D Unspecified fracture of lower end of left humerus, subsequent encounter for fracture with routine healing: Secondary | ICD-10-CM | POA: Diagnosis not present

## 2019-04-20 ENCOUNTER — Other Ambulatory Visit (INDEPENDENT_AMBULATORY_CARE_PROVIDER_SITE_OTHER): Payer: BC Managed Care – PPO

## 2019-04-20 ENCOUNTER — Other Ambulatory Visit: Payer: Self-pay

## 2019-04-20 DIAGNOSIS — E781 Pure hyperglyceridemia: Secondary | ICD-10-CM

## 2019-04-21 LAB — LIPID PANEL
Chol/HDL Ratio: 2.6 ratio (ref 0.0–4.4)
Cholesterol, Total: 162 mg/dL (ref 100–199)
HDL: 62 mg/dL (ref >39)
LDL Calculated: 55 mg/dL (ref 0–99)
Triglycerides: 223 mg/dL — ABNORMAL HIGH (ref 0–149)
VLDL Cholesterol Cal: 45 mg/dL — ABNORMAL HIGH (ref 5–40)

## 2019-04-28 DIAGNOSIS — M24522 Contracture, left elbow: Secondary | ICD-10-CM | POA: Diagnosis not present

## 2019-04-30 DIAGNOSIS — S42402D Unspecified fracture of lower end of left humerus, subsequent encounter for fracture with routine healing: Secondary | ICD-10-CM | POA: Diagnosis not present

## 2019-05-12 DIAGNOSIS — R05 Cough: Secondary | ICD-10-CM | POA: Diagnosis not present

## 2019-05-12 DIAGNOSIS — Z6821 Body mass index (BMI) 21.0-21.9, adult: Secondary | ICD-10-CM | POA: Diagnosis not present

## 2019-05-12 DIAGNOSIS — Z209 Contact with and (suspected) exposure to unspecified communicable disease: Secondary | ICD-10-CM | POA: Diagnosis not present

## 2019-05-22 DIAGNOSIS — S42402D Unspecified fracture of lower end of left humerus, subsequent encounter for fracture with routine healing: Secondary | ICD-10-CM | POA: Diagnosis not present

## 2019-05-29 DIAGNOSIS — M24522 Contracture, left elbow: Secondary | ICD-10-CM | POA: Diagnosis not present

## 2019-06-02 ENCOUNTER — Other Ambulatory Visit: Payer: Self-pay | Admitting: Orthopedic Surgery

## 2019-06-02 DIAGNOSIS — S42402D Unspecified fracture of lower end of left humerus, subsequent encounter for fracture with routine healing: Secondary | ICD-10-CM

## 2019-06-05 ENCOUNTER — Other Ambulatory Visit: Payer: Self-pay | Admitting: Orthopedic Surgery

## 2019-06-05 DIAGNOSIS — S42402D Unspecified fracture of lower end of left humerus, subsequent encounter for fracture with routine healing: Secondary | ICD-10-CM

## 2019-06-11 ENCOUNTER — Ambulatory Visit
Admission: RE | Admit: 2019-06-11 | Discharge: 2019-06-11 | Disposition: A | Discharge status: Home / Self Care | Payer: BLUE CROSS/BLUE SHIELD | Source: Ambulatory Visit | Attending: Orthopedic Surgery | Admitting: Orthopedic Surgery

## 2019-06-11 DIAGNOSIS — S52022D Displaced fracture of olecranon process without intraarticular extension of left ulna, subsequent encounter for closed fracture with routine healing: Secondary | ICD-10-CM | POA: Diagnosis not present

## 2019-06-11 DIAGNOSIS — S42402D Unspecified fracture of lower end of left humerus, subsequent encounter for fracture with routine healing: Secondary | ICD-10-CM

## 2019-06-11 DIAGNOSIS — S42492D Other displaced fracture of lower end of left humerus, subsequent encounter for fracture with routine healing: Secondary | ICD-10-CM | POA: Diagnosis not present

## 2019-06-18 DIAGNOSIS — S42402D Unspecified fracture of lower end of left humerus, subsequent encounter for fracture with routine healing: Secondary | ICD-10-CM | POA: Diagnosis not present

## 2019-06-22 DIAGNOSIS — M25529 Pain in unspecified elbow: Secondary | ICD-10-CM | POA: Diagnosis not present

## 2019-06-22 DIAGNOSIS — M24522 Contracture, left elbow: Secondary | ICD-10-CM | POA: Diagnosis not present

## 2019-06-28 DIAGNOSIS — M24522 Contracture, left elbow: Secondary | ICD-10-CM | POA: Diagnosis not present

## 2019-07-05 DIAGNOSIS — J020 Streptococcal pharyngitis: Secondary | ICD-10-CM | POA: Diagnosis not present

## 2019-07-29 DIAGNOSIS — M24522 Contracture, left elbow: Secondary | ICD-10-CM | POA: Diagnosis not present

## 2019-08-13 DIAGNOSIS — S42402D Unspecified fracture of lower end of left humerus, subsequent encounter for fracture with routine healing: Secondary | ICD-10-CM | POA: Diagnosis not present

## 2019-08-26 DIAGNOSIS — L218 Other seborrheic dermatitis: Secondary | ICD-10-CM | POA: Diagnosis not present

## 2019-08-26 DIAGNOSIS — L905 Scar conditions and fibrosis of skin: Secondary | ICD-10-CM | POA: Diagnosis not present

## 2019-08-26 DIAGNOSIS — D225 Melanocytic nevi of trunk: Secondary | ICD-10-CM | POA: Diagnosis not present

## 2019-08-28 DIAGNOSIS — M24522 Contracture, left elbow: Secondary | ICD-10-CM | POA: Diagnosis not present

## 2019-08-28 DIAGNOSIS — S42472A Displaced transcondylar fracture of left humerus, initial encounter for closed fracture: Secondary | ICD-10-CM | POA: Diagnosis not present

## 2019-08-28 DIAGNOSIS — G8918 Other acute postprocedural pain: Secondary | ICD-10-CM | POA: Diagnosis not present

## 2019-08-28 DIAGNOSIS — Z472 Encounter for removal of internal fixation device: Secondary | ICD-10-CM | POA: Diagnosis not present

## 2019-09-01 DIAGNOSIS — M25629 Stiffness of unspecified elbow, not elsewhere classified: Secondary | ICD-10-CM | POA: Diagnosis not present

## 2019-09-01 DIAGNOSIS — S42402D Unspecified fracture of lower end of left humerus, subsequent encounter for fracture with routine healing: Secondary | ICD-10-CM | POA: Diagnosis not present

## 2019-09-03 DIAGNOSIS — M25629 Stiffness of unspecified elbow, not elsewhere classified: Secondary | ICD-10-CM | POA: Diagnosis not present

## 2019-09-04 DIAGNOSIS — M25629 Stiffness of unspecified elbow, not elsewhere classified: Secondary | ICD-10-CM | POA: Diagnosis not present

## 2019-09-07 DIAGNOSIS — M25629 Stiffness of unspecified elbow, not elsewhere classified: Secondary | ICD-10-CM | POA: Diagnosis not present

## 2019-09-09 DIAGNOSIS — M25629 Stiffness of unspecified elbow, not elsewhere classified: Secondary | ICD-10-CM | POA: Diagnosis not present

## 2019-09-11 DIAGNOSIS — M25629 Stiffness of unspecified elbow, not elsewhere classified: Secondary | ICD-10-CM | POA: Diagnosis not present

## 2019-09-25 DIAGNOSIS — A749 Chlamydial infection, unspecified: Secondary | ICD-10-CM

## 2019-09-25 HISTORY — DX: Chlamydial infection, unspecified: A74.9

## 2019-10-19 ENCOUNTER — Ambulatory Visit (INDEPENDENT_AMBULATORY_CARE_PROVIDER_SITE_OTHER): Payer: BC Managed Care – PPO | Admitting: Adult Health

## 2019-10-19 ENCOUNTER — Encounter: Payer: Self-pay | Admitting: Adult Health

## 2019-10-19 ENCOUNTER — Other Ambulatory Visit: Payer: Self-pay

## 2019-10-19 DIAGNOSIS — F331 Major depressive disorder, recurrent, moderate: Secondary | ICD-10-CM | POA: Diagnosis not present

## 2019-10-19 DIAGNOSIS — F411 Generalized anxiety disorder: Secondary | ICD-10-CM

## 2019-10-19 DIAGNOSIS — F4329 Adjustment disorder with other symptoms: Secondary | ICD-10-CM | POA: Diagnosis not present

## 2019-10-19 MED ORDER — FLUOXETINE HCL 10 MG PO CAPS: ORAL_CAPSULE | ORAL | 2 refills | Status: DC

## 2019-10-19 NOTE — Progress Notes (Signed)
[facility] MD/PA/NP Initial Note  10/19/2019 10:02 AM Miranda Salinas  MRN:  [id number]  Chief Complaint:   HPI: Describes mood today as 'so-so". Tearful at times. Mood symptoms - reports depression and anxiety. Denies irritability. Stating feeling bad in May or June. Had surgery on her left arm in March after a ski accident and a second surgery December. In physical therapy. Has a limited range of motion. Stating the "accident and Covid have made things worse". Taking virtual classes and not seeing "an end to all this". Getting out when in [place] and seeing "some" friends. Stating "I see the same people". Worried about school and internships. Has always worried about academic and her future. Has been trying to work on maintaining a "routine". Worries about what others think of her in social settings - weather I'm doing a good enough job - "does my supervisor like me". Not new symptoms, but has worsened since college. Stable interest and motivation. Taking medications as prescribed.  Energy levels low. Active, has a regular exercise routine - walking and running daily. 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 Enjoys some usual interests and activities. Single. Lives between home and her apartment in [place]. Has 3 room-mates. Spending time with family. Appetite adequate. Weight stable. Sleeps well most nights. Averages 6 to 7 hours. Can "sleep too much sometimes." Denies daytime napping.  Focus and concentration difficulties. Has a "hard time" completing tasks. Procrastination. Completing tasks. Managing aspects of household. 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 Denies AH or VH. Started seeing a therapist virtually in August of 2020. Has developed healthier habits.   Previous medication trials: Denies  Visit Diagnosis: No diagnosis found.  Past Psychiatric History: Denies psychiatric hospitalization.  Past  Medical History:  Past Medical History:  Diagnosis Date  . Hypertriglyceridemia 2020  . Medical history non-contributory     Past Surgical History:  Procedure Laterality Date  . ORIF HUMERUS FRACTURE Left 12/05/2018   Procedure: OPEN REDUCTION INTERNAL FIXATION (ORIF) LEFT DISTAL HUMERUS FRACTURE;  Surgeon: Iran Planas, MD;  Location: [facility];  Service: Orthopedics;  Laterality: Left;  . THROAT SURGERY     cyst on throat    Family Psychiatric History: Denies any family history of mental illness.  Family History:  Family History  Problem Relation Age of Onset  . Kidney cancer Father   . Hypertension Maternal Grandfather   . Breast cancer Paternal Grandmother     Social History:  Social History   Socioeconomic History  . Marital status: Single    Spouse name: Not on file  . Number of children: Not on file  . Years of education: Not on file  . Highest education level: Not on file  Occupational History  . Not on file  Tobacco Use  . Smoking status: Never Smoker  . Smokeless tobacco: Never Used  Substance and Sexual Activity  . Alcohol use: Yes    Comment: twice a week   . Drug use: No  . Sexual activity: Not Currently    Partners: Male    Birth control/protection: Inserts    Comment: Nuvaring  Other Topics Concern  . Not on file  Social History Narrative  . Not on file   Social Determinants of Health   Financial Resource Strain:   . Difficulty of Paying Living Expenses: Not on file  Food Insecurity:   . Worried About [employer] of  Food in the Last Year: Not on file  . Ran Out of Food in the Last Year: Not on file  Transportation Needs:   . Lack of Transportation (Medical): Not on file  . Lack of Transportation (Non-Medical): Not on file  Physical Activity:   . Days of Exercise per Week: Not on file  . Minutes of Exercise per Session: Not on file  Stress:   . Feeling of Stress : Not on file  Social Connections:   . Frequency of Communication with Friends  and Family: Not on file  . Frequency of Social Gatherings with Friends and Family: Not on file  . Attends Religious Services: Not on file  . Active Member of Clubs or Organizations: Not on file  . Attends Banker Meetings: Not on file  . Marital Status: Not on file    Allergies:  Allergies  Allergen Reactions  . Penicillins Rash    Did it involve swelling of the face/tongue/throat, SOB, or low BP? Unknown Did it involve sudden or severe rash/hives, skin peeling, or any reaction on the inside of your mouth or nose? Yes Did you need to seek medical attention at a hospital or doctor's office? No When did it last happen?childhood  If all above answers are "NO", may proceed with cephalosporin use.     Metabolic Disorder Labs: No results found for: HGBA1C, MPG No results found for: PROLACTIN Lab Results  Component Value Date   CHOL 162 04/20/2019   TRIG 223 (H) 04/20/2019   HDL 62 04/20/2019   CHOLHDL 2.6 04/20/2019   LDLCALC 55 04/20/2019   LDLCALC 105 02/12/2019   No results found for: TSH  Therapeutic Level Labs: No results found for: LITHIUM No results found for: VALPROATE No components found for:  CBMZ  Current Medications: Current Outpatient Medications  Medication Sig Dispense Refill  . ibuprofen (ADVIL,MOTRIN) 800 MG tablet Take 800 mg by mouth every 6 (six) hours as needed (pain).    Marland Kitchen NUVARING 0.12-0.015 MG/24HR vaginal ring INSERT 1 RING VAGINALLY AND LEAVE IN PLACE FOR 3 CONSECUTIVE WEEKS, THEN REMOVE FOR ONE WEEK 3 each 3   No current facility-administered medications for this visit.    Medication Side Effects: none  Orders placed this visit:  No orders of the defined types were placed in this encounter.   Psychiatric Specialty Exam:  Review of Systems  Musculoskeletal: Negative for gait problem.  Neurological: Negative for tremors.  Psychiatric/Behavioral:       Please refer to HPI    There were no vitals taken for this  visit.There is no height or weight on file to calculate BMI.  General Appearance: Neat and Well Groomed  Eye Contact:  Good  Speech:  Clear and Coherent and Normal Rate  Volume:  Normal  Mood:  Anxious and Depressed  Affect:  Appropriate and Congruent  Thought Process:  Coherent  Orientation:  Full (Time, Place, and Person)  Thought Content: Logical   Suicidal Thoughts:  No  Homicidal Thoughts:  No  Memory:  WNL  Judgement:  Good  Insight:  Good  Psychomotor Activity:  Normal  Concentration:  Concentration: Good  Recall:  Good  Fund of Knowledge: Good  Language: Good  Assets:  Communication Skills Desire for Improvement Financial Resources/Insurance Housing Intimacy Leisure Time Physical Health Resilience Social Support Talents/Skills Transportation Vocational/Educational  ADL's:  Intact  Cognition: WNL  Prognosis:  Good   Screenings: Mood questionnaire  Receiving Psychotherapy: Yes - [facility]  Treatment Plan/Recommendations:   Plan:  PDMP reviewed  1. Add Prozac 20mg  daily  RTC 4 weeks  Patient advised to contact office with any questions, adverse effects, or acute worsening in signs and symptoms.  Greater than 50% of face to face time with patient was spent on counseling and coordination of care. We discussed medications, therapy, supplements, guides on managing symptoms, therapy.  , NP

## 2019-11-16 ENCOUNTER — Encounter: Payer: Self-pay | Admitting: Adult Health

## 2019-11-16 ENCOUNTER — Ambulatory Visit (INDEPENDENT_AMBULATORY_CARE_PROVIDER_SITE_OTHER): Payer: BC Managed Care – PPO | Admitting: Adult Health

## 2019-11-16 DIAGNOSIS — F4329 Adjustment disorder with other symptoms: Secondary | ICD-10-CM | POA: Diagnosis not present

## 2019-11-16 DIAGNOSIS — F331 Major depressive disorder, recurrent, moderate: Secondary | ICD-10-CM | POA: Diagnosis not present

## 2019-11-16 DIAGNOSIS — F411 Generalized anxiety disorder: Secondary | ICD-10-CM | POA: Diagnosis not present

## 2019-11-16 NOTE — Progress Notes (Signed)
Miranda Salinas [id number] 04/21/2001 19 y.o.  Virtual Visit via Telephone Note  I connected with pt on 11/16/19 at  9:00 AM EST by telephone and verified that I am speaking with the correct person using two identifiers.   I discussed the limitations, risks, security and privacy concerns of performing an evaluation and management service by telephone and the availability of in person appointments. I also discussed with the patient that there may be a patient responsible charge related to this service. The patient expressed understanding and agreed to proceed.   I discussed the assessment and treatment plan with the patient. The patient was provided an opportunity to ask questions and all were answered. The patient agreed with the plan and demonstrated an understanding of the instructions.   The patient was advised to call back or seek an in-person evaluation if the symptoms worsen or if the condition fails to improve as anticipated.  I provided 30 minutes of non-face-to-face time during this encounter.  The patient was located at home.  The provider was located at [place].   Aloha Gell, NP   Subjective:   Patient ID:  Miranda Salinas is a 19 y.o. (DOB [birth year]) female.  Chief Complaint:  Chief Complaint  Patient presents with  . Depression  . Anxiety  . Adjustment Disorder    HPI   Miranda Salinas presents for follow-up of MDD, GAD, and adjustment.   Describes mood today as "better". Denies tearfulness. Mood symptoms - reports decreased depression, anxiety, and  irritability. Stating "I think I'm doing better". Working with P/T from surgery on her left arm from an ski accident she had in March. Taking virtual classes - living off campus in [place]. Not as "worried" about things. Decreased "social anxiety". Has noticed she is able to start and finish tasks. Stable interest and motivation. Taking medications as prescribed.  Energy levels  improved. Active, has a regular exercise routine. 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 Enjoys some usual interests and activities. Single. Lives between home and her apartment in [place]. Has 3 room-mates. Spending time with family. Appetite adequate. Weight stable. Sleeps well most nights. Averages 7 to 8 hours.  Focus and concentration difficulties - "better overall". Easier to start things. Completing tasks. Managing aspects of household. 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 Denies AH or VH. Started seeing a therapist virtually in August of 2020. Has developed healthier habits.   Previous medication trials: Denies  Review of Systems:  Review of Systems  Musculoskeletal: Negative for gait problem.  Neurological: Negative for tremors.  Psychiatric/Behavioral:       Please refer to HPI    Medications: I have reviewed the patient's current medications.  Current Outpatient Medications  Medication Sig Dispense Refill  . FLUoxetine (PROZAC) 10 MG capsule Take one capsule daily x 7 days, then two capsules daily. 60 capsule 2  . NUVARING 0.12-0.015 MG/24HR vaginal ring INSERT 1 RING VAGINALLY AND LEAVE IN PLACE FOR 3 CONSECUTIVE WEEKS, THEN REMOVE FOR ONE WEEK 3 each 3   No current facility-administered medications for this visit.    Medication Side Effects: None  Allergies:  Allergies  Allergen Reactions  . Penicillins Rash    Did it involve swelling of the face/tongue/throat, SOB, or low BP? Unknown Did it involve sudden or severe rash/hives, skin peeling, or any reaction on the inside of your mouth or  nose? Yes Did you need to seek medical attention at a hospital or doctor's office? No When did it last happen?childhood  If all above answers are "NO", may proceed with cephalosporin use.     Past Medical History:  Diagnosis Date  . Hypertriglyceridemia 2020  . Medical history  non-contributory     Family History  Problem Relation Age of Onset  . Kidney cancer Father   . Hypertension Maternal Grandfather   . Breast cancer Paternal Grandmother     Social History   Socioeconomic History  . Marital status: Single    Spouse name: Not on file  . Number of children: Not on file  . Years of education: Not on file  . Highest education level: Not on file  Occupational History  . Not on file  Tobacco Use  . Smoking status: Never Smoker  . Smokeless tobacco: Never Used  Substance and Sexual Activity  . Alcohol use: Yes    Comment: twice a week   . Drug use: No  . Sexual activity: Not Currently    Partners: Male    Birth control/protection: Inserts    Comment: Nuvaring  Other Topics Concern  . Not on file  Social History Narrative  . Not on file   Social Determinants of Health   Financial Resource Strain:   . Difficulty of Paying Living Expenses: Not on file  Food Insecurity:   . Worried About Programme researcher, broadcasting/film/video in the Last Year: Not on file  . Ran Out of Food in the Last Year: Not on file  Transportation Needs:   . Lack of Transportation (Medical): Not on file  . Lack of Transportation (Non-Medical): Not on file  Physical Activity:   . Days of Exercise per Week: Not on file  . Minutes of Exercise per Session: Not on file  Stress:   . Feeling of Stress : Not on file  Social Connections:   . Frequency of Communication with Friends and Family: Not on file  . Frequency of Social Gatherings with Friends and Family: Not on file  . Attends Religious Services: Not on file  . Active Member of Clubs or Organizations: Not on file  . Attends Banker Meetings: Not on file  . Marital Status: Not on file  Intimate Partner Violence:   . Fear of Current or Ex-Partner: Not on file  . Emotionally Abused: Not on file  . Physically Abused: Not on file  . Sexually Abused: Not on file    Past Medical History, Surgical history, Social history,  and Family history were reviewed and updated as appropriate.   Please see review of systems for further details on the patient's review from today.   Objective:   Physical Exam:  There were no vitals taken for this visit.  Physical Exam Constitutional:      General: She is not in acute distress.    Appearance: She is well-developed.  Musculoskeletal:        General: No deformity.  Neurological:     Mental Status: She is alert and oriented to person, place, and time.     Coordination: Coordination normal.  Psychiatric:        Attention and Perception: Attention and perception normal. She does not perceive auditory or visual hallucinations.        Mood and Affect: Mood normal. Mood is not anxious or depressed. Affect is not labile, blunt, angry or inappropriate.        Speech: Speech  normal.        Behavior: Behavior normal.        Thought Content: Thought content normal. Thought content is not paranoid or delusional. Thought content does not include homicidal or suicidal ideation. Thought content does not include homicidal or suicidal plan.        Cognition and Memory: Cognition and memory normal.        Judgment: Judgment normal.     Comments: Insight intact     Lab Review:     Component Value Date/Time   NA 142 02/12/2019 1021   K 4.6 02/12/2019 1021   CL 106 02/12/2019 1021   CO2 21 02/12/2019 1021   GLUCOSE 81 02/12/2019 1021   GLUCOSE 85 05/20/2007 1236   BUN 10 02/12/2019 1021   CREATININE 0.78 02/12/2019 1021   CALCIUM 9.5 02/12/2019 1021   PROT 7.1 02/12/2019 1021   ALBUMIN 4.5 02/12/2019 1021   AST 11 02/12/2019 1021   ALT 7 02/12/2019 1021   ALKPHOS 60 02/12/2019 1021   BILITOT 0.2 02/12/2019 1021   GFRNONAA 111 02/12/2019 1021   GFRAA 127 02/12/2019 1021       Component Value Date/Time   WBC 5.0 02/12/2019 1021   WBC 8.1 12/05/2018 1214   RBC 4.57 02/12/2019 1021   RBC 4.47 12/05/2018 1214   HGB 13.2 02/12/2019 1021   HCT 40.2 02/12/2019 1021    PLT 351 02/12/2019 1021   MCV 88 02/12/2019 1021   MCH 28.9 02/12/2019 1021   MCH 28.4 12/05/2018 1214   MCHC 32.8 02/12/2019 1021   MCHC 32.5 12/05/2018 1214   RDW 12.7 02/12/2019 1021    No results found for: POCLITH, LITHIUM   No results found for: PHENYTOIN, PHENOBARB, VALPROATE, CBMZ   .res Assessment: Plan:    Plan:  PDMP reviewed  Continue Prozac 20mg  daily  RTC 6 weeks  Patient advised to contact office with any questions, adverse effects, or acute worsening in signs and symptoms.  Greater than 50% of face to face time with patient was spent on counseling and coordination of care. We discussed medications, therapy, supplements, guides on managing symptoms, therapy.  Rachella was seen today for depression, anxiety and adjustment disorder.  Diagnoses and all orders for this visit:  Adjustment disorder with other symptom  Major depressive disorder, recurrent episode, moderate (HCC)  Generalized anxiety disorder    Please see After Visit Summary for patient specific instructions.  Future Appointments  Date Time Provider Department Center  02/17/2020 10:00 AM 02/19/2020, Ardell Isaacs, MD [facility] None    No orders of the defined types were placed in this encounter.     -------------------------------

## 2020-02-03 ENCOUNTER — Other Ambulatory Visit: Payer: Self-pay

## 2020-02-03 DIAGNOSIS — F411 Generalized anxiety disorder: Secondary | ICD-10-CM

## 2020-02-03 DIAGNOSIS — F331 Major depressive disorder, recurrent, moderate: Secondary | ICD-10-CM

## 2020-02-03 DIAGNOSIS — F4329 Adjustment disorder with other symptoms: Secondary | ICD-10-CM

## 2020-02-03 MED ORDER — FLUOXETINE HCL 20 MG PO CAPS: ORAL_CAPSULE | ORAL | 0 refills | Status: DC

## 2020-02-14 ENCOUNTER — Other Ambulatory Visit: Payer: Self-pay | Admitting: Obstetrics and Gynecology

## 2020-02-15 NOTE — Telephone Encounter (Signed)
Medication refill request: NuvaRing Last AEX:  02/12/19 Dr. Edward Jolly Next AEX: 02/17/20 Last MMG (if hormonal medication request): n/a Refill authorized: Please advise; patient has AEX Wednesday 5/26

## 2020-02-17 ENCOUNTER — Ambulatory Visit: Payer: Self-pay | Admitting: Obstetrics and Gynecology

## 2020-03-08 ENCOUNTER — Other Ambulatory Visit: Payer: Self-pay

## 2020-03-08 DIAGNOSIS — F4329 Adjustment disorder with other symptoms: Secondary | ICD-10-CM

## 2020-03-08 DIAGNOSIS — F331 Major depressive disorder, recurrent, moderate: Secondary | ICD-10-CM

## 2020-03-08 DIAGNOSIS — F411 Generalized anxiety disorder: Secondary | ICD-10-CM

## 2020-03-08 NOTE — Telephone Encounter (Signed)
Needs appt

## 2020-03-13 ENCOUNTER — Other Ambulatory Visit: Payer: Self-pay | Admitting: Obstetrics and Gynecology

## 2020-03-14 NOTE — Telephone Encounter (Signed)
Medication refill request: Nuvaring  Last AEX:  02-12-2019 BS  Next AEX: 10-15-20 Last MMG (if hormonal medication request): n/a Refill authorized: Today, please advise.   Medication pended for #3,1RF. Please refill if appropriate.

## 2020-04-05 ENCOUNTER — Encounter: Payer: Self-pay | Admitting: Adult Health

## 2020-04-05 ENCOUNTER — Telehealth (INDEPENDENT_AMBULATORY_CARE_PROVIDER_SITE_OTHER): Payer: BC Managed Care – PPO | Admitting: Adult Health

## 2020-04-05 DIAGNOSIS — F4329 Adjustment disorder with other symptoms: Secondary | ICD-10-CM

## 2020-04-05 DIAGNOSIS — F331 Major depressive disorder, recurrent, moderate: Secondary | ICD-10-CM | POA: Diagnosis not present

## 2020-04-05 DIAGNOSIS — F411 Generalized anxiety disorder: Secondary | ICD-10-CM | POA: Diagnosis not present

## 2020-04-05 MED ORDER — FLUOXETINE HCL 10 MG PO CAPS: ORAL_CAPSULE | ORAL | 5 refills | Status: AC

## 2020-04-05 NOTE — Progress Notes (Signed)
Miranda Salinas [id number] 2000/09/25 19 y.o.  Virtual Visit via Video Note  I connected with pt @ on 04/05/20 at  1:20 PM EDT by a video enabled telemedicine application and verified that I am speaking with the correct person using two identifiers.   I discussed the limitations of evaluation and management by telemedicine and the availability of in person appointments. The patient expressed understanding and agreed to proceed.  I discussed the assessment and treatment plan with the patient. The patient was provided an opportunity to ask questions and all were answered. The patient agreed with the plan and demonstrated an understanding of the instructions.   The patient was advised to call back or seek an in-person evaluation if the symptoms worsen or if the condition fails to improve as anticipated.  I provided 30 minutes of non-face-to-face time during this encounter.  The patient was located at home.  The provider was located at [facility].   Dorothyann Gibbs, NP   Subjective:   Patient ID:  Miranda Salinas is a 19 y.o. (DOB Nov 25, 2000) female.  Chief Complaint: No chief complaint on file.   HPI Miranda Salinas presents for follow-up of MDD, GAD, and adjustment disorder.   Describes mood today as "better". Denies tearfulness. Mood symptoms - feels anxious and depression. Denies irritability. Stating "I ran out of my Prozac a month ago and would like to restart it". Felt like is was helping - more so after running out. 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 Stable interest and motivation. Taking medications as prescribed.  Energy levels "ok, could be better". Active, has a regular exercise routine. Walking/Yoga. Enjoys some usual interests and activities. Single. Lives between home and her apartment in [place]. Lives with room-mates. Spending time with family. Appetite  adequate. Weight stable - 138 pounds.  Sleeps well most nights. Averages 7 to 8 hours.  Focus and concentration "hard". Not sure if it's because of the virtual environment or being off the medication. Completing tasks. Managing aspects of household. Full time student. Denies SI or HI. Denies AH or VH. Started seeing a therapist virtually in August of 2020. Has developed healthier habits.   Previous medication trials: Denies  Review of Systems:  Review of Systems  Musculoskeletal: Negative for gait problem.  Neurological: Negative for tremors.  Psychiatric/Behavioral:       Please refer to HPI    Medications: I have reviewed the patient's current medications.  Current Outpatient Medications  Medication Sig Dispense Refill  . FLUoxetine (PROZAC) 10 MG capsule Take one capsule twice daily. 60 capsule 5  . NUVARING 0.12-0.015 MG/24HR vaginal ring INSERT 1 RING VAGINALLY AND LEAVE IN PLACE FOR 3 CONSECUTIVE WEEKS, THEN REMOVE FOR 1 WEEK 3 each 1   No current facility-administered medications for this visit.    Medication Side Effects: None  Allergies:  Allergies  Allergen Reactions  . Penicillins Rash and Hives    Did it involve swelling of the face/tongue/throat, SOB, or low BP? Unknown Did it involve sudden or severe rash/hives, skin peeling, or any reaction on the inside of your mouth or nose? Yes Did you need to seek medical attention at a hospital or doctor's office? No When did it last happen?childhood  If all above answers are "NO", may proceed with cephalosporin use.  Did it involve swelling of the face/tongue/throat, SOB, or low BP? Unknown Did it involve sudden or  severe rash/hives, skin peeling, or any reaction on the inside of your mouth or nose? Yes Did you need to seek medical attention at a hospital or doctor's office? No When did it last happen?childhood  If all above answers are "NO", may proceed with cephalosporin use.    Past Medical History:   Diagnosis Date  . Hypertriglyceridemia 2020  . Medical history non-contributory     Family History  Problem Relation Age of Onset  . Kidney cancer Father   . Hypertension Maternal Grandfather   . Breast cancer Paternal Grandmother     Social History   Socioeconomic History  . Marital status: Single    Spouse name: Not on file  . Number of children: Not on file  . Years of education: Not on file  . Highest education level: Not on file  Occupational History  . Not on file  Tobacco Use  . Smoking status: Never Smoker  . Smokeless tobacco: Never Used  Vaping Use  . Vaping Use: Never used  Substance and Sexual Activity  . Alcohol use: Yes    Comment: twice a week   . Drug use: No  . Sexual activity: Not Currently    Partners: Male    Birth control/protection: Inserts    Comment: Nuvaring  Other Topics Concern  . Not on file  Social History Narrative  . Not on file   Social Determinants of Health   Financial Resource Strain:   . Difficulty of Paying Living Expenses:   Food Insecurity:   . Worried About Programme researcher, broadcasting/film/video in the Last Year:   . Barista in the Last Year:   Transportation Needs:   . Freight forwarder (Medical):   Marland Kitchen Lack of Transportation (Non-Medical):   Physical Activity:   . Days of Exercise per Week:   . Minutes of Exercise per Session:   Stress:   . Feeling of Stress :   Social Connections:   . Frequency of Communication with Friends and Family:   . Frequency of Social Gatherings with Friends and Family:   . Attends Religious Services:   . Active Member of Clubs or Organizations:   . Attends Banker Meetings:   Marland Kitchen Marital Status:   Intimate Partner Violence:   . Fear of Current or Ex-Partner:   . Emotionally Abused:   Marland Kitchen Physically Abused:   . Sexually Abused:     Past Medical History, Surgical history, Social history, and Family history were reviewed and updated as appropriate.   Please see review of systems  for further details on the patient's review from today.   Objective:   Physical Exam:  There were no vitals taken for this visit.  Physical Exam Constitutional:      General: She is not in acute distress. Musculoskeletal:        General: No deformity.  Neurological:     Mental Status: She is alert and oriented to person, place, and time.     Coordination: Coordination normal.  Psychiatric:        Attention and Perception: Attention and perception normal. She does not perceive auditory or visual hallucinations.        Mood and Affect: Mood normal. Mood is not anxious or depressed. Affect is not labile, blunt, angry or inappropriate.        Speech: Speech normal.        Behavior: Behavior normal.        Thought Content: Thought content  normal. Thought content is not paranoid or delusional. Thought content does not include homicidal or suicidal ideation. Thought content does not include homicidal or suicidal plan.        Cognition and Memory: Cognition and memory normal.        Judgment: Judgment normal.     Comments: Insight intact     Lab Review:     Component Value Date/Time   NA 142 02/12/2019 1021   K 4.6 02/12/2019 1021   CL 106 02/12/2019 1021   CO2 21 02/12/2019 1021   GLUCOSE 81 02/12/2019 1021   GLUCOSE 85 05/20/2007 1236   BUN 10 02/12/2019 1021   CREATININE 0.78 02/12/2019 1021   CALCIUM 9.5 02/12/2019 1021   PROT 7.1 02/12/2019 1021   ALBUMIN 4.5 02/12/2019 1021   AST 11 02/12/2019 1021   ALT 7 02/12/2019 1021   ALKPHOS 60 02/12/2019 1021   BILITOT 0.2 02/12/2019 1021   GFRNONAA 111 02/12/2019 1021   GFRAA 127 02/12/2019 1021       Component Value Date/Time   WBC 5.0 02/12/2019 1021   WBC 8.1 12/05/2018 1214   RBC 4.57 02/12/2019 1021   RBC 4.47 12/05/2018 1214   HGB 13.2 02/12/2019 1021   HCT 40.2 02/12/2019 1021   PLT 351 02/12/2019 1021   MCV 88 02/12/2019 1021   MCH 28.9 02/12/2019 1021   MCH 28.4 12/05/2018 1214   MCHC 32.8 02/12/2019 1021    MCHC 32.5 12/05/2018 1214   RDW 12.7 02/12/2019 1021    No results found for: POCLITH, LITHIUM   No results found for: PHENYTOIN, PHENOBARB, VALPROATE, CBMZ   .res Assessment: Plan:    Plan:  PDMP reviewed  Restart Prozac 20mg  daily - 10mg  BID  RTC 6 months  Patient advised to contact office with any questions, adverse effects, or acute worsening in signs and symptoms.   Diagnoses and all orders for this visit:  Generalized anxiety disorder -     FLUoxetine (PROZAC) 10 MG capsule; Take one capsule twice daily.  Major depressive disorder, recurrent episode, moderate (HCC) -     FLUoxetine (PROZAC) 10 MG capsule; Take one capsule twice daily.  Adjustment disorder with other symptom -     FLUoxetine (PROZAC) 10 MG capsule; Take one capsule twice daily.     Please see After Visit Summary for patient specific instructions.  Future Appointments  Date Time Provider Department Center  10/05/2020  1:30 PM , 12/03/2020, MD [facility] None    No orders of the defined types were placed in this encounter.     -------------------------------

## 2020-05-25 IMAGING — CT CT ELBOW*L* W/O CM
2 of 6 series · 6 of 14 positions shown, 7 images · non-contrast
Comparison: None.

CLINICAL DATA: Fracture of the elbow, pain with bending

EXAM:
CT of the left elbow, 3-DIMENSIONAL CT IMAGE RENDERING ON
INDEPENDENT WORKSTATION
TECHNIQUE: Noncontrast CT images of the left elbow were obtained. 3-dimensional
CT images were rendered by post-processing of the original CT data
on an independent workstation. The 3-dimensional CT images were
interpreted and findings were reported in the accompanying complete
CT report for this study

[Series 3: elbow 1.50 br60 s3 axial bone hd fov · axial · 0.29mm/px · z∈[-1099,-999]mm · 3 of 249 slices shown, 4 images]
[im 63/249  soft-tissue]
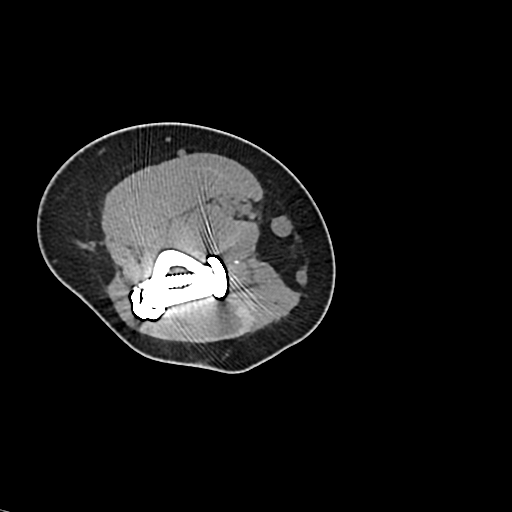
[im 63/249  bone]
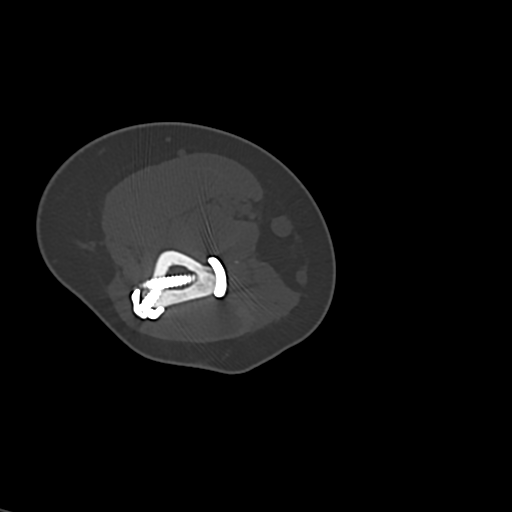
[im 125/249  bone]
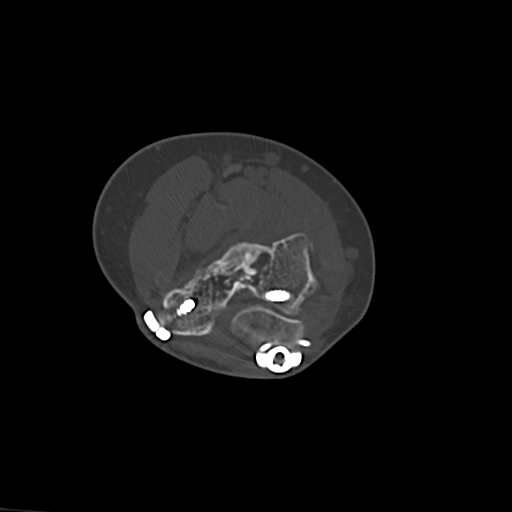
[im 187/249  bone]
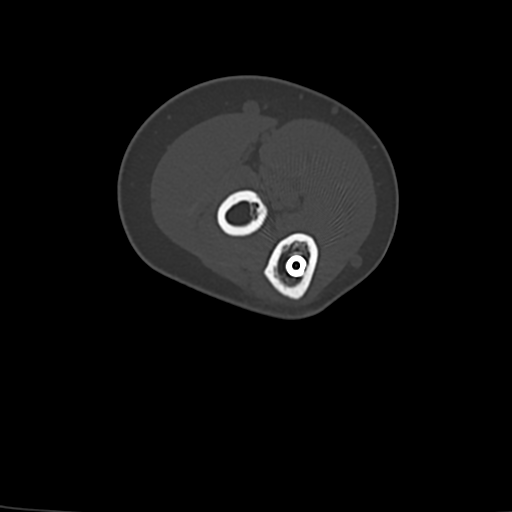

[Series 5: elbow 1.50 br40 s3 axial st hd fov · axial · 0.29mm/px · z∈[-1099,-999]mm · 3 of 249 slices shown]
[im 63/249  bone]
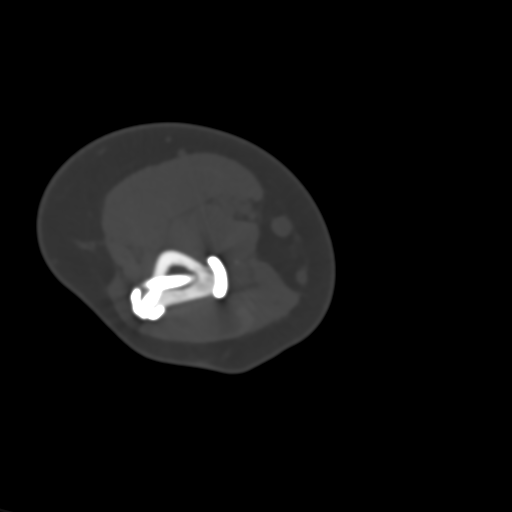
[im 125/249  bone]
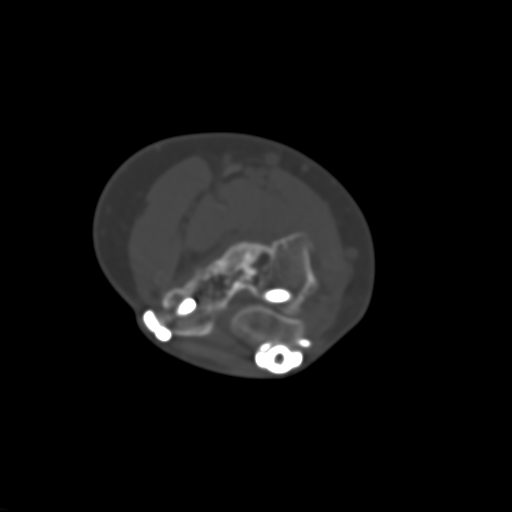
[im 187/249  bone]
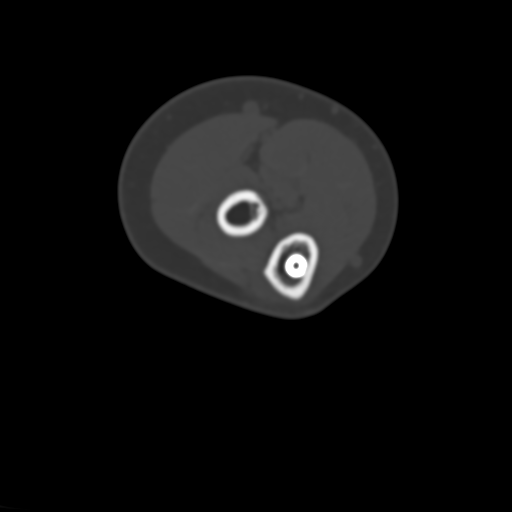

[6 of 14 positions shown; findings below may reference images not displayed]

FINDINGS: Bones/joints: The patient is status post ORIF with plate and screw
fixation of the distal humerus. There is healed fracture deformity
of the distal humerus. No periprosthetic lucency or acute fracture
is identified. There is also intramedullary screw fixation of the
olecranon with posterior wire fixation. No periprosthetic lucency is
seen. There is near anatomic alignment of the ulna and radius. There
is tiny calcifications seen at the insertion site of the common
flexor tendon. No large elbow joint effusion is seen.

Ligaments: Suboptimally visualized

Muscles and tendons: The muscles appear to be intact. No evidence of
fatty atrophy or focal muscular disruption. Although not optimally
visualized the tendons appear to be intact.

Soft tissues: Mild soft tissue swelling seen over the posterior
elbow. No soft tissue mass or focal fluid collection.
IMPRESSION: 1. Status post ORIF of distal humerus and olecranon . No hardware
complication and healed fracture deformity of the distal humerus.
There is near anatomic alignment around the elbow.
2. Findings suggestive of mild insertional calcific tendinosis of
the common flexor tendon.

## 2020-08-21 ENCOUNTER — Encounter (HOSPITAL_COMMUNITY): Payer: Self-pay

## 2020-08-21 ENCOUNTER — Ambulatory Visit (HOSPITAL_COMMUNITY)
Admission: EM | Admit: 2020-08-21 | Discharge: 2020-08-21 | Disposition: A | Discharge status: Home / Self Care | Payer: BC Managed Care – PPO | Attending: Emergency Medicine | Admitting: Emergency Medicine

## 2020-08-21 ENCOUNTER — Other Ambulatory Visit: Payer: Self-pay

## 2020-08-21 DIAGNOSIS — J029 Acute pharyngitis, unspecified: Secondary | ICD-10-CM

## 2020-08-21 DIAGNOSIS — Z20822 Contact with and (suspected) exposure to covid-19: Secondary | ICD-10-CM | POA: Insufficient documentation

## 2020-08-21 DIAGNOSIS — J069 Acute upper respiratory infection, unspecified: Secondary | ICD-10-CM | POA: Diagnosis present

## 2020-08-21 DIAGNOSIS — R059 Cough, unspecified: Secondary | ICD-10-CM | POA: Diagnosis not present

## 2020-08-21 LAB — POCT RAPID STREP A, ED / UC: Streptococcus, Group A Screen (Direct): NEGATIVE

## 2020-08-21 MED ORDER — FLUTICASONE PROPIONATE 50 MCG/ACT NA SUSP: 1.0000 | Freq: Every day | NASAL | 0 refills | Status: DC

## 2020-08-21 MED ORDER — BENZONATATE 200 MG PO CAPS: 200.0000 mg | ORAL_CAPSULE | Freq: Three times a day (TID) | ORAL | 0 refills | Status: AC | PRN

## 2020-08-21 NOTE — ED Provider Notes (Signed)
[facility]    CSN: [id number] Arrival date & time: 08/21/20  1502      History   Chief Complaint Chief Complaint  Patient presents with  . Sore Throat  . Cough    HPI Miranda Salinas is a 19 y.o. female presenting today for evaluation of sore throat cough and congestion.  Patient reports that symptoms began last night.  She has also had associated fatigue and body aches.  Concerned about possible strep as she gets this frequently.  Denies close sick contacts.  Using over-the-counter cold and flu medicine and ibuprofen.   HPI  Past Medical History:  Diagnosis Date  . Hypertriglyceridemia 2020  . Medical history non-contributory     Patient Active Problem List   Diagnosis Date Noted  . Closed fracture of distal end of left humerus 12/05/2018    Past Surgical History:  Procedure Laterality Date  . ORIF HUMERUS FRACTURE Left 12/05/2018   Procedure: OPEN REDUCTION INTERNAL FIXATION (ORIF) LEFT DISTAL HUMERUS FRACTURE;  Surgeon: Bradly Bienenstock, MD;  Location: [facility] OR;  Service: Orthopedics;  Laterality: Left;  . THROAT SURGERY     cyst on throat    OB History    Gravida  0   Para  0   Term  0   Preterm  0   AB  0   Living  0     SAB  0   TAB  0   Ectopic  0   Multiple  0   Live Births  0            Home Medications    Prior to Admission medications   Medication Sig Start Date End Date Taking? Authorizing Provider  benzonatate (TESSALON) 200 MG capsule Take 1 capsule (200 mg total) by mouth 3 (three) times daily as needed for up to 7 days for cough. 08/21/20 08/28/20  Greyson Peavy C, PA-C  FLUoxetine (PROZAC) 10 MG capsule Take one capsule twice daily. 04/05/20   Mozingo, Thereasa Solo, NP  fluticasone (FLONASE) 50 MCG/ACT nasal spray Place 1-2 sprays into both nostrils daily. 08/21/20   Evalena Fujii C, PA-C  NUVARING 0.12-0.015 MG/24HR vaginal ring INSERT 1 RING VAGINALLY AND LEAVE IN PLACE FOR 3 CONSECUTIVE WEEKS, THEN  REMOVE FOR 1 WEEK 03/14/20   Patton Salles, MD    Family History Family History  Problem Relation Age of Onset  . Kidney cancer Father   . Hypertension Maternal Grandfather   . Breast cancer Paternal Grandmother     Social History Social History   Tobacco Use  . Smoking status: Never Smoker  . Smokeless tobacco: Never Used  Vaping Use  . Vaping Use: Never used  Substance Use Topics  . Alcohol use: Yes    Comment: twice a week   . Drug use: No     Allergies   Penicillins   Review of Systems Review of Systems  Constitutional: Positive for fatigue. Negative for activity change, appetite change, chills and fever.  HENT: Positive for congestion, rhinorrhea and sore throat. Negative for ear pain, sinus pressure and trouble swallowing.   Eyes: Negative for discharge and redness.  Respiratory: Positive for cough. Negative for chest tightness and shortness of breath.   Cardiovascular: Negative for chest pain.  Gastrointestinal: Negative for abdominal pain, diarrhea, nausea and vomiting.  Musculoskeletal: Positive for myalgias.  Skin: Negative for rash.  Neurological: Negative for dizziness, light-headedness and headaches.     Physical Exam Triage Vital Signs  ED Triage Vitals  Enc Vitals Group     BP 08/21/20 1600 122/69     Pulse Rate 08/21/20 1600 80     Resp 08/21/20 1600 16     Temp 08/21/20 1600 98.1 F (36.7 C)     Temp Source 08/21/20 1600 Oral     SpO2 08/21/20 1600 98 %     Weight --      Height --      Head Circumference --      Peak Flow --      Pain Score 08/21/20 1601 2     Pain Loc --      Pain Edu? --      Excl. in GC? --    No data found.  Updated Vital Signs BP 122/69 (BP Location: Right Arm)   Pulse 80   Temp 98.1 F (36.7 C) (Oral)   Resp 16   SpO2 98%   Visual Acuity Right Eye Distance:   Left Eye Distance:   Bilateral Distance:    Right Eye Near:   Left Eye Near:    Bilateral Near:     Physical Exam Vitals and  nursing note reviewed.  Constitutional:      Appearance: She is well-developed.     Comments: No acute distress  HENT:     Head: Normocephalic and atraumatic.     Ears:     Comments: Bilateral ears without tenderness to palpation of external auricle, tragus and mastoid, EAC's without erythema or swelling, TM's with good bony landmarks and cone of light. Non erythematous.     Nose: Nose normal.     Mouth/Throat:     Comments: Oral mucosa pink and moist, no tonsillar enlargement or exudate. Posterior pharynx patent and nonerythematous, no uvula deviation or swelling. Normal phonation. Eyes:     Conjunctiva/sclera: Conjunctivae normal.  Cardiovascular:     Rate and Rhythm: Normal rate and regular rhythm.  Pulmonary:     Effort: Pulmonary effort is normal. No respiratory distress.     Comments: Breathing comfortably at rest, CTABL, no wheezing, rales or other adventitious sounds auscultated Abdominal:     General: There is no distension.  Musculoskeletal:        General: Normal range of motion.     Cervical back: Neck supple.  Skin:    General: Skin is warm and dry.  Neurological:     Mental Status: She is alert and oriented to person, place, and time.      UC Treatments / Results  Labs (all labs ordered are listed, but only abnormal results are displayed) Labs Reviewed  CULTURE, GROUP A STREP ([facility])  SARS CORONAVIRUS 2 (TAT 6-24 HRS)  POCT RAPID STREP A, ED / UC    EKG   Radiology No results found.  Procedures Procedures (including critical care time)  Medications Ordered in UC Medications - No data to display  Initial Impression / Assessment and Plan / UC Course  I have reviewed the triage vital signs and the nursing notes.  Pertinent labs & imaging results that were available during my care of the patient were reviewed by me and considered in my medical decision making (see chart for details).     Covid test pending, strep test negative, suspect likely viral  URI and recommending symptomatic and supportive care.  Rest and fluids.  Continue to monitor,Discussed strict return precautions. Patient verbalized understanding and is agreeable with plan.  Final Clinical Impressions(s) / UC Diagnoses   Final  diagnoses:  Viral URI with cough     Discharge Instructions     Strep test negative, Covid test pending, monitor my chart for results Rest and fluids Tylenol and ibuprofen as needed for sore throat, body aches Flonase nasal spray for sinus congestion/pressure Tessalon for cough every 8 hours Follow-up if not improving or worsening    ED Prescriptions    Medication Sig Dispense Auth. Provider   fluticasone (FLONASE) 50 MCG/ACT nasal spray Place 1-2 sprays into both nostrils daily. 16 g Kasim Mccorkle C, PA-C   benzonatate (TESSALON) 200 MG capsule Take 1 capsule (200 mg total) by mouth 3 (three) times daily as needed for up to 7 days for cough. 28 capsule Kyro Joswick, Marbury C, PA-C     PDMP not reviewed this encounter.   Lew Dawes, [place] 08/21/20 1630

## 2020-08-21 NOTE — ED Triage Notes (Signed)
Pt present sore throat with cough and congestion. Symptom started last night. Pt states she has been feeling fatigue and having body aches.

## 2020-08-21 NOTE — Discharge Instructions (Signed)
Strep test negative, Covid test pending, monitor my chart for results Rest and fluids Tylenol and ibuprofen as needed for sore throat, body aches Flonase nasal spray for sinus congestion/pressure Tessalon for cough every 8 hours Follow-up if not improving or worsening

## 2020-08-22 LAB — SARS CORONAVIRUS 2 (TAT 6-24 HRS): SARS Coronavirus 2: NEGATIVE

## 2020-08-25 LAB — CULTURE, GROUP A STREP (THRC)

## 2020-09-01 ENCOUNTER — Other Ambulatory Visit: Payer: Self-pay | Admitting: Obstetrics and Gynecology

## 2020-09-01 NOTE — Telephone Encounter (Signed)
Medication refill request: NuvaRing Last AEX:  02/12/19 Dr. Edward Jolly Next AEX: 10/05/20 Last MMG (if hormonal medication request): n/a Refill authorized: today, please advise

## 2020-10-03 ENCOUNTER — Other Ambulatory Visit: Payer: Self-pay | Admitting: Obstetrics and Gynecology

## 2020-10-04 NOTE — Progress Notes (Deleted)
20 y.o. G0P0000 Single Caucasian female here for annual exam.    PCP:     No LMP recorded.           Sexually active: {yes no:314532}  The current method of family planning is NuvaRing vaginal inserts.    Exercising: {yes no:314532}  {types:19826} Smoker:  no  Health Maintenance: Pap:  never History of abnormal Pap:  n/a TDaP:  ~2018  Gardasil:   yes HIV: 02-12-2019 negative  Hep C: 02-12-2019 negative  Screening Labs:  Hb today: ***, Urine today: ***   reports that she has never smoked. She has never used smokeless tobacco. She reports current alcohol use. She reports that she does not use drugs.  Past Medical History:  Diagnosis Date  . Hypertriglyceridemia 2020  . Medical history non-contributory     Past Surgical History:  Procedure Laterality Date  . ORIF HUMERUS FRACTURE Left 12/05/2018   Procedure: OPEN REDUCTION INTERNAL FIXATION (ORIF) LEFT DISTAL HUMERUS FRACTURE;  Surgeon: Bradly Bienenstock, MD;  Location: [facility];  Service: Orthopedics;  Laterality: Left;  . THROAT SURGERY     cyst on throat    Current Outpatient Medications  Medication Sig Dispense Refill  . FLUoxetine (PROZAC) 10 MG capsule Take one capsule twice daily. 60 capsule 5  . fluticasone (FLONASE) 50 MCG/ACT nasal spray Place 1-2 sprays into both nostrils daily. 16 g 0  . NUVARING 0.12-0.015 MG/24HR vaginal ring INSERT 1 RING VAGINALLY AND LEAVE IN PLACE FOR 3 CONSECUTIVE WEEKS, THEN REMOVE FOR 1 WEEK 3 each 0   No current facility-administered medications for this visit.    Family History  Problem Relation Age of Onset  . Kidney cancer Father   . Hypertension Maternal Grandfather   . Breast cancer Paternal Grandmother     Review of Systems  Exam:   There were no vitals taken for this visit.    General appearance: alert, cooperative and appears stated age Head: normocephalic, without obvious abnormality, atraumatic Neck: no adenopathy, supple, symmetrical, trachea midline and thyroid normal to  inspection and palpation Lungs: clear to auscultation bilaterally Breasts: normal appearance, no masses or tenderness, No nipple retraction or dimpling, No nipple discharge or bleeding, No axillary adenopathy Heart: regular rate and rhythm Abdomen: soft, non-tender; no masses, no organomegaly Extremities: extremities normal, atraumatic, no cyanosis or edema Skin: skin color, texture, turgor normal. No rashes or lesions Lymph nodes: cervical, supraclavicular, and axillary nodes normal. Neurologic: grossly normal  Pelvic: External genitalia:  no lesions              No abnormal inguinal nodes palpated.              Urethra:  normal appearing urethra with no masses, tenderness or lesions              Bartholins and Skenes: normal                 Vagina: normal appearing vagina with normal color and discharge, no lesions              Cervix: no lesions              Pap taken: {yes no:314532} Bimanual Exam:  Uterus:  normal size, contour, position, consistency, mobility, non-tender              Adnexa: no mass, fullness, tenderness              Rectal exam: {yes no:314532}.  Confirms.  Anus:  normal sphincter tone, no lesions  Chaperone was present for exam.  Assessment:   Well woman visit with normal exam.   Plan: Mammogram screening discussed. Self breast awareness reviewed. Pap and HR HPV as above. Guidelines for Calcium, Vitamin D, regular exercise program including cardiovascular and weight bearing exercise.   Follow up annually and prn.   Additional counseling given.  {yes [id number]. _______ minutes face to face time of which over 50% was spent in counseling.    After visit summary provided.

## 2020-10-05 ENCOUNTER — Ambulatory Visit: Payer: Self-pay | Admitting: Obstetrics and Gynecology

## 2020-10-06 ENCOUNTER — Other Ambulatory Visit: Payer: Self-pay

## 2020-10-06 MED ORDER — NUVARING 0.12-0.015 MG/24HR VA RING: VAGINAL_RING | VAGINAL | 0 refills | Status: DC

## 2020-10-06 NOTE — Telephone Encounter (Signed)
Patient called. She called stating she needs her Nuvaring refilled asap. She has not been in since 01/2019 for CE.  She had appointment scheduled 10/05/20 at 1:30pm that she no showed.   I asked CLaudia to call her and reschedule. She is rescheduled for 10/21/20 art 2:30pm.  Are you comfortable to give her one until visit?

## 2020-10-06 NOTE — Telephone Encounter (Signed)
Spoke with patient and let her know Rx sent. Stressed important to keep scheduled appt.

## 2020-10-21 ENCOUNTER — Encounter: Payer: Self-pay | Admitting: Obstetrics and Gynecology

## 2020-10-21 ENCOUNTER — Ambulatory Visit (INDEPENDENT_AMBULATORY_CARE_PROVIDER_SITE_OTHER): Payer: Commercial Managed Care - PPO | Admitting: Obstetrics and Gynecology

## 2020-10-21 ENCOUNTER — Other Ambulatory Visit (HOSPITAL_COMMUNITY)
Admission: RE | Admit: 2020-10-21 | Discharge: 2020-10-21 | Disposition: A | Discharge status: Home / Self Care | Payer: Commercial Managed Care - PPO | Source: Ambulatory Visit | Attending: Obstetrics and Gynecology | Admitting: Obstetrics and Gynecology

## 2020-10-21 ENCOUNTER — Other Ambulatory Visit: Payer: Self-pay

## 2020-10-21 VITALS — BP 122/68 | HR 62 | Resp 16 | Ht 67.0 in | Wt 139.0 lb

## 2020-10-21 DIAGNOSIS — Z01419 Encounter for gynecological examination (general) (routine) without abnormal findings: Secondary | ICD-10-CM | POA: Diagnosis not present

## 2020-10-21 DIAGNOSIS — Z113 Encounter for screening for infections with a predominantly sexual mode of transmission: Secondary | ICD-10-CM | POA: Diagnosis present

## 2020-10-21 MED ORDER — ETONOGESTREL-ETHINYL ESTRADIOL 0.12-0.015 MG/24HR VA RING: 1.0000 | VAGINAL_RING | VAGINAL | 3 refills | Status: DC

## 2020-10-21 NOTE — Progress Notes (Signed)
20 y.o. G0P0000 Single Caucasian female here for annual exam.    Wants an alternative to the NuvaRing.  Difficulty remembering to take a pill.  Steady relationship for one year. Hx chlamydia in past.   Graduating from college in May and moving to [facility] this summer.   Received her Covid bosster.  Received her flu vaccine.   PCP:   None per patient   Patient's last menstrual period was 10/07/2020.     Period Cycle (Days): 28 Period Duration (Days): 3-4 Period Pattern: Regular Menstrual Flow: Light Menstrual Control: Tampon Menstrual Control Change Freq (Hours): changes tampon 3 times a day Dysmenorrhea: (!) Mild Dysmenorrhea Symptoms: Cramping     Sexually active: Yes.    The current method of family planning is NuvaRing vaginal inserts.    Exercising: Yes.    hot yoga, strength training, walking Smoker:  no  Health Maintenance: Pap:  never History of abnormal Pap:  n/a TDaP:  ~2018  Gardasil:   yes HIV: 02-12-2019 negative  Hep C: 02-12-2019 negative  Screening Labs:  Hb today: discuss with provider, Urine today: not collected    reports that she has never smoked. She has never used smokeless tobacco. She reports current alcohol use. She reports that she does not use drugs.  Past Medical History:  Diagnosis Date  . Chlamydia 2021  . Hypertriglyceridemia 2020  . Medical history non-contributory     Past Surgical History:  Procedure Laterality Date  . ORIF HUMERUS FRACTURE Left 12/05/2018   Procedure: OPEN REDUCTION INTERNAL FIXATION (ORIF) LEFT DISTAL HUMERUS FRACTURE;  Surgeon: Bradly Bienenstock, MD;  Location: [facility] OR;  Service: Orthopedics;  Laterality: Left;  . THROAT SURGERY     cyst on throat    Current Outpatient Medications  Medication Sig Dispense Refill  . etonogestrel-ethinyl estradiol (NUVARING) 0.12-0.015 MG/24HR vaginal ring Place 1 each vaginally every 28 (twenty-eight) days. Insert vaginally and leave in place for 3 consecutive weeks, then remove for 1  week. 3 each 3  . FLUoxetine (PROZAC) 10 MG capsule Take one capsule twice daily. 60 capsule 5   No current facility-administered medications for this visit.    Family History  Problem Relation Age of Onset  . Kidney cancer Father   . Hypertension Maternal Grandfather   . Breast cancer Paternal Grandmother     Review of Systems  All other systems reviewed and are negative.   Exam:   BP 122/68 (BP Location: Left Arm, Patient Position: Sitting, Cuff Size: Normal)   Pulse 62   Resp 16   Ht 5\' 7"  (1.702 m)   Wt 139 lb (63 kg)   LMP 10/07/2020   BMI 21.77 kg/m     General appearance: alert, cooperative and appears stated age Head: normocephalic, without obvious abnormality, atraumatic Neck: no adenopathy, supple, symmetrical, trachea midline and thyroid normal to inspection and palpation Lungs: clear to auscultation bilaterally Breasts: normal appearance, no masses or tenderness, No nipple retraction or dimpling, No nipple discharge or bleeding, No axillary adenopathy Heart: regular rate and rhythm Abdomen: soft, non-tender; no masses, no organomegaly Extremities: extremities normal, atraumatic, no cyanosis or edema Skin: skin color, texture, turgor normal. No rashes or lesions Lymph nodes: cervical, supraclavicular, and axillary nodes normal. Neurologic: grossly normal  Pelvic: External genitalia:  no lesions              No abnormal inguinal nodes palpated.              Urethra:  normal appearing urethra with  no masses, tenderness or lesions              Bartholins and Skenes: normal                 Vagina: normal appearing vagina with normal color and discharge, no lesions              Cervix: no lesions              Pap taken: Yes.   Bimanual Exam:  Uterus:  normal size, contour, position, consistency, mobility, non-tender              Adnexa: no mass, fullness, tenderness         Chaperone was present for exam.  Assessment:   Well woman visit with normal exam. Hx  chlamydia. STD screening.  Plan: Mammogram screening discussed. Self breast awareness reviewed. Pap and HR HPV as above. Guidelines for Calcium, Vitamin D, regular exercise program including cardiovascular and weight bearing exercise. STD screening and routine labs.  We discussed alternatives to her name brand Nuvaring including generic option, OrthoEvra, OCPs, Nexplanon, and IUDs.  She prefers to try generic NuvaRing.  We reviewed increased risk of ectopic pregnancy due to hx of chlamydia. I recommend early prenatal care if future pregnancy is desired.  Follow up annually and prn.

## 2020-10-21 NOTE — Patient Instructions (Signed)

## 2020-10-24 LAB — HEPATITIS B SURFACE ANTIGEN: Hepatitis B Surface Ag: NONREACTIVE

## 2020-10-24 LAB — COMPREHENSIVE METABOLIC PANEL
AG Ratio: 1.7 (calc) (ref 1.0–2.5)
ALT: 13 U/L (ref 6–29)
AST: 15 U/L (ref 10–30)
Albumin: 4.6 g/dL (ref 3.6–5.1)
Alkaline phosphatase (APISO): 63 U/L (ref 31–125)
BUN: 10 mg/dL (ref 7–25)
CO2: 25 mmol/L (ref 20–32)
Calcium: 9.9 mg/dL (ref 8.6–10.2)
Chloride: 101 mmol/L (ref 98–110)
Creat: 0.73 mg/dL (ref 0.50–1.10)
Globulin: 2.7 g/dL (calc) (ref 1.9–3.7)
Glucose, Bld: 77 mg/dL (ref 65–99)
Potassium: 4.1 mmol/L (ref 3.5–5.3)
Sodium: 135 mmol/L (ref 135–146)
Total Bilirubin: 0.5 mg/dL (ref 0.2–1.2)
Total Protein: 7.3 g/dL (ref 6.1–8.1)

## 2020-10-24 LAB — HIV ANTIBODY (ROUTINE TESTING W REFLEX): HIV 1&2 Ab, 4th Generation: NONREACTIVE

## 2020-10-24 LAB — CBC
HCT: 39.3 % (ref 35.0–45.0)
Hemoglobin: 13.3 g/dL (ref 11.7–15.5)
MCH: 29.6 pg (ref 27.0–33.0)
MCHC: 33.8 g/dL (ref 32.0–36.0)
MCV: 87.5 fL (ref 80.0–100.0)
MPV: 9.8 fL (ref 7.5–12.5)
Platelets: 377 10*3/uL (ref 140–400)
RBC: 4.49 10*6/uL (ref 3.80–5.10)
RDW: 11.8 % (ref 11.0–15.0)
WBC: 7.6 10*3/uL (ref 3.8–10.8)

## 2020-10-24 LAB — LIPID PANEL
Cholesterol: 231 mg/dL — ABNORMAL HIGH (ref <200)
HDL: 85 mg/dL (ref >=50)
LDL Cholesterol (Calc): 111 mg/dL (calc) — ABNORMAL HIGH
Non-HDL Cholesterol (Calc): 146 mg/dL (calc) — ABNORMAL HIGH (ref <130)
Total CHOL/HDL Ratio: 2.7 (calc) (ref <5.0)
Triglycerides: 232 mg/dL — ABNORMAL HIGH (ref <150)

## 2020-10-24 LAB — RPR: RPR Ser Ql: NONREACTIVE

## 2020-10-24 LAB — HEPATITIS C ANTIBODY
Hepatitis C Ab: NONREACTIVE
SIGNAL TO CUT-OFF: 0.01 (ref <1.00)

## 2020-10-25 LAB — CYTOLOGY - PAP
Chlamydia: NEGATIVE
Comment: NEGATIVE
Comment: NEGATIVE
Comment: NORMAL
Diagnosis: NEGATIVE
Diagnosis: REACTIVE
Neisseria Gonorrhea: NEGATIVE
Trichomonas: NEGATIVE

## 2021-03-30 ENCOUNTER — Other Ambulatory Visit: Payer: Self-pay

## 2021-03-30 ENCOUNTER — Ambulatory Visit (HOSPITAL_COMMUNITY)
Admission: EM | Admit: 2021-03-30 | Discharge: 2021-03-30 | Disposition: A | Discharge status: Home / Self Care | Payer: Commercial Managed Care - PPO | Attending: Family Medicine | Admitting: Family Medicine

## 2021-03-30 ENCOUNTER — Encounter (HOSPITAL_COMMUNITY): Payer: Self-pay

## 2021-03-30 DIAGNOSIS — R198 Other specified symptoms and signs involving the digestive system and abdomen: Secondary | ICD-10-CM

## 2021-03-30 DIAGNOSIS — R0989 Other specified symptoms and signs involving the circulatory and respiratory systems: Secondary | ICD-10-CM

## 2021-03-30 MED ORDER — LANSOPRAZOLE 30 MG PO CPDR: 30.0000 mg | DELAYED_RELEASE_CAPSULE | Freq: Every day | ORAL | 0 refills | Status: AC

## 2021-03-30 MED ORDER — LANSOPRAZOLE 30 MG PO CPDR: 30.0000 mg | DELAYED_RELEASE_CAPSULE | Freq: Every day | ORAL | 0 refills | Status: DC

## 2021-03-30 NOTE — ED Provider Notes (Signed)
[facility]   [id number] 03/30/21 Arrival Time: 1451  ASSESSMENT & PLAN:  1. Globus pharyngeus    No neck mass identified. Discussed possible GERD etiology.  Begin: trial of Meds ordered this encounter  Medications   lansoprazole (PREVACID) 30 MG capsule    Sig: Take 1 capsule (30 mg total) by mouth daily.    Dispense:  30 capsule    Refill:  0   Will cut down on alcohol use.  Recommend:  Follow-up Information     [facility].   Why: If worsening or failing to improve as anticipated. Contact information: [address] [place] [address] [phone]                 Reviewed expectations re: course of current medical issues. Questions answered. Outlined signs and symptoms indicating need for more acute intervention. Patient verbalized understanding. After Visit Summary given.   SUBJECTIVE: History from: patient.  Miranda Salinas is a 20 y.o. female who presents with complaint of feeling like her throat is swelling and feeling like it's difficult to swallow at times. Mostly has noticed over the past week or so. Normal PO intake without n/v/d. Appetite: normal. No respiratory difficulties. Has been drinking a little more alcohol than usual since summer began. Non-smoker. Wt stable. No voice changes. Is belching more frequently. No tx PTA. Denies street drug use.  Patient's last menstrual period was 03/08/2021.  Past Surgical History:  Procedure Laterality Date   ORIF HUMERUS FRACTURE Left 12/05/2018   Procedure: OPEN REDUCTION INTERNAL FIXATION (ORIF) LEFT DISTAL HUMERUS FRACTURE;  Surgeon: Bradly Bienenstock, MD;  Location: [facility] OR;  Service: Orthopedics;  Laterality: Left;   THROAT SURGERY     cyst on throat    OBJECTIVE:  Vitals:   03/30/21 1515  BP: 129/75  Pulse: 75  Resp: 16  Temp: 98.3 F (36.8 C)  TempSrc: Oral  SpO2: 99%    General appearance: alert; no distress Oropharynx: moist;  throat appears normal; uvula midline Neck: supple with FROM; no masses appreciated Lungs: unlabored Skin: warm; dry Psychological: alert and cooperative; normal mood and affect  Labs Reviewed: Results for orders placed or performed in visit on 10/21/20  Lipid panel  Result Value Ref Range   Cholesterol 231 (H) <200 mg/dL   HDL 85 > OR = 50 mg/dL   Triglycerides 619 (H) <150 mg/dL   LDL Cholesterol (Calc) 111 (H) mg/dL (calc)   Total CHOL/HDL Ratio 2.7 <5.0 (calc)   Non-HDL Cholesterol (Calc) 146 (H) <130 mg/dL (calc)  CBC  Result Value Ref Range   WBC 7.6 3.8 - 10.8 Thousand/uL   RBC 4.49 3.80 - 5.10 Million/uL   Hemoglobin 13.3 11.7 - 15.5 g/dL   HCT 50.9 32.6 - 71.2 %   MCV 87.5 80.0 - 100.0 fL   MCH 29.6 27.0 - 33.0 pg   MCHC 33.8 32.0 - 36.0 g/dL   RDW 45.8 09.9 - 83.3 %   Platelets 377 140 - 400 Thousand/uL   MPV 9.8 7.5 - 12.5 fL  Comprehensive metabolic panel  Result Value Ref Range   Glucose, Bld 77 65 - 99 mg/dL   BUN 10 7 - 25 mg/dL   Creat 8.25 0.53 - 9.76 mg/dL   BUN/Creatinine Ratio NOT APPLICABLE 6 - 22 (calc)   Sodium 135 135 - 146 mmol/L   Potassium 4.1 3.5 - 5.3 mmol/L   Chloride 101 98 - 110 mmol/L   CO2  25 20 - 32 mmol/L   Calcium 9.9 8.6 - 10.2 mg/dL   Total Protein 7.3 6.1 - 8.1 g/dL   Albumin 4.6 3.6 - 5.1 g/dL   Globulin 2.7 1.9 - 3.7 g/dL (calc)   AG Ratio 1.7 1.0 - 2.5 (calc)   Total Bilirubin 0.5 0.2 - 1.2 mg/dL   Alkaline phosphatase (APISO) 63 31 - 125 U/L   AST 15 10 - 30 U/L   ALT 13 6 - 29 U/L  RPR  Result Value Ref Range   RPR Ser Ql NON-REACTIVE NON-REACTI  Hepatitis B surface antigen  Result Value Ref Range   Hepatitis B Surface Ag NON-REACTIVE NON-REACTI  Hepatitis C antibody  Result Value Ref Range   Hepatitis C Ab NON-REACTIVE NON-REACTI   SIGNAL TO CUT-OFF 0.01 <1.00  HIV Antibody (routine testing w rflx)  Result Value Ref Range   HIV 1&2 Ab, 4th Generation NON-REACTIVE NON-REACTI  Cytology - PAP( )  Result  Value Ref Range   Trichomonas Negative    Chlamydia Negative    Neisseria Gonorrhea Negative    Adequacy      Satisfactory for evaluation; transformation zone component PRESENT.   Diagnosis      - Negative for Intraepithelial Lesions or Malignancy (NILM)   Diagnosis - Benign reactive/reparative changes    Comment Normal Reference Range Trichomonas - Negative    Comment Normal Reference Ranger Chlamydia - Negative    Comment      Normal Reference Range Neisseria Gonorrhea - Negative    Allergies  Allergen Reactions   Penicillins Rash and Hives    Did it involve swelling of the face/tongue/throat, SOB, or low BP? Unknown Did it involve sudden or severe rash/hives, skin peeling, or any reaction on the inside of your mouth or nose? Yes Did you need to seek medical attention at a hospital or doctor's office? No When did it last happen?      childhood  If all above answers are "NO", may proceed with cephalosporin use.  Did it involve swelling of the face/tongue/throat, SOB, or low BP? Unknown Did it involve sudden or severe rash/hives, skin peeling, or any reaction on the inside of your mouth or nose? Yes Did you need to seek medical attention at a hospital or doctor's office? No When did it last happen?      childhood  If all above answers are "NO", may proceed with cephalosporin use.                                               Past Medical History:  Diagnosis Date   Chlamydia 2021   Hypertriglyceridemia 2020   Medical history non-contributory    Social History   Socioeconomic History   Marital status: Single    Spouse name: Not on file   Number of children: Not on file   Years of education: Not on file   Highest education level: Not on file  Occupational History   Not on file  Tobacco Use   Smoking status: Never   Smokeless tobacco: Never  Vaping Use   Vaping Use: Never used  Substance and Sexual Activity   Alcohol use: Yes    Comment: twice a week    Drug use: No    Sexual activity: Not Currently    Partners: Male    Birth control/protection: Inserts  Comment: Nuvaring  Other Topics Concern   Not on file  Social History Narrative   Not on file   Social Determinants of Health   Financial Resource Strain: Not on file  Food Insecurity: Not on file  Transportation Needs: Not on file  Physical Activity: Not on file  Stress: Not on file  Social Connections: Not on file  Intimate Partner Violence: Not on file   Family History  Problem Relation Age of Onset   Kidney cancer Father    Hypertension Maternal Grandfather    Breast cancer Paternal Dulce Sellar, MD 03/30/21 [phone]

## 2021-03-30 NOTE — ED Triage Notes (Signed)
Pt present sore throat with difficulty swallowing, symptoms started a week ago.pt states that her throat pain is comes and go's

## 2021-07-25 ENCOUNTER — Other Ambulatory Visit: Payer: Self-pay | Admitting: Obstetrics and Gynecology

## 2021-07-25 NOTE — Telephone Encounter (Signed)
Last AEX 10/21/20.

## 2021-10-19 ENCOUNTER — Other Ambulatory Visit: Payer: Self-pay | Admitting: Obstetrics and Gynecology

## 2021-10-19 NOTE — Telephone Encounter (Signed)
Spoke with patient and informed her refill sent.

## 2021-10-19 NOTE — Telephone Encounter (Signed)
Pt called appt with [place] provider is 11/02/2021.

## 2021-10-19 NOTE — Telephone Encounter (Signed)
AEX was 10/21/20.  Appt desk called her to schedule and patient told them. She has moved from [place] to [place]. She just needs one refill until she goes to her new gyn

## 2021-10-19 NOTE — Telephone Encounter (Signed)
Needs to schedule AEX. I asked appt desk to call her and arrange prior to routing refill request to Dr. Edward Jolly.

## 2024-04-15 ENCOUNTER — Other Ambulatory Visit: Payer: Self-pay | Admitting: Medical Genetics

## 2024-07-03 ENCOUNTER — Other Ambulatory Visit: Payer: Self-pay | Admitting: Medical Genetics

## 2024-07-03 DIAGNOSIS — Z006 Encounter for examination for normal comparison and control in clinical research program: Secondary | ICD-10-CM
